# Patient Record
Sex: Female | Born: 1985 | Hispanic: Yes | Marital: Married | Smoking: Never smoker
Health system: Southern US, Community
[De-identification: ages and names within clinical notes are randomized; demographics above are authoritative.]

## PROBLEM LIST (undated history)

## (undated) DIAGNOSIS — D573 Sickle-cell trait: Secondary | ICD-10-CM

## (undated) DIAGNOSIS — O24419 Gestational diabetes mellitus in pregnancy, unspecified control: Secondary | ICD-10-CM

## (undated) DIAGNOSIS — O4400 Placenta previa specified as without hemorrhage, unspecified trimester: Secondary | ICD-10-CM

## (undated) HISTORY — DX: Sickle-cell trait: D57.3

## (undated) HISTORY — DX: Gestational diabetes mellitus in pregnancy, unspecified control: O24.419

## (undated) HISTORY — DX: Complete placenta previa nos or without hemorrhage, unspecified trimester: O44.00

---

## 2018-06-06 NOTE — L&D Delivery Note (Signed)
       Delivery Note   Brenda Rogers is a 33 y.o. G2P0101 at [redacted]w[redacted]d Estimated Date of Delivery: 05/26/19  PRE-OPERATIVE DIAGNOSIS:  1) [redacted]w[redacted]d pregnancy.  2) Labor  POST-OPERATIVE DIAGNOSIS:  1) [redacted]w[redacted]d pregnancy s/p Vaginal, Spontaneous  2) Viable female infant  Delivery Type: Vaginal, Spontaneous    Delivery Anesthesia: None   Labor Complications:      ESTIMATED BLOOD LOSS: 125  ml    FINDINGS:   1) female infant, Apgar scores of 8   at 1 minute and 9   at 5 minutes and a birthweight of   ounces.    2) Nuchal cord: Yes  SPECIMENS:   PLACENTA:   Appearance: Intact    Removal: Spontaneous      Disposition:    DISPOSITION:  Infant to left in stable condition in the delivery room, with L&D personnel and mother,  NARRATIVE SUMMARY: Labor course:  Ms. [name] is a G2P0101 at [redacted]w[redacted]d who presented for labor management.  She progressed well in labor without pitocin. AROM was performed. She evidenced good maternal expulsive effort during the second stage. She went on to deliver a viable infant. The placenta delivered without problems and was noted to be complete. A perineal and vaginal examination was performed. Episiotomy/Lacerations: None   Finis Bud, M.D. 05/18/2019 5:12 PM

## 2018-10-18 ENCOUNTER — Other Ambulatory Visit: Payer: Self-pay

## 2018-10-18 ENCOUNTER — Ambulatory Visit: Payer: Self-pay | Admitting: Certified Nurse Midwife

## 2018-10-18 ENCOUNTER — Encounter: Payer: Self-pay | Admitting: Certified Nurse Midwife

## 2018-10-18 VITALS — BP 128/86 | HR 110 | Ht <= 58 in | Wt 131.6 lb

## 2018-10-18 DIAGNOSIS — N912 Amenorrhea, unspecified: Secondary | ICD-10-CM

## 2018-10-18 DIAGNOSIS — R102 Pelvic and perineal pain: Secondary | ICD-10-CM

## 2018-10-18 DIAGNOSIS — Z8759 Personal history of other complications of pregnancy, childbirth and the puerperium: Secondary | ICD-10-CM

## 2018-10-18 LAB — POCT URINALYSIS DIPSTICK OB
Bilirubin, UA: NEGATIVE
Blood, UA: NEGATIVE
Glucose, UA: NEGATIVE
Ketones, UA: NEGATIVE
Leukocytes, UA: NEGATIVE
Nitrite, UA: NEGATIVE
POC,PROTEIN,UA: NEGATIVE
Spec Grav, UA: 1.02 (ref 1.010–1.025)
Urobilinogen, UA: 0.2 E.U./dL
pH, UA: 6 (ref 5.0–8.0)

## 2018-10-18 LAB — POCT URINE PREGNANCY: Preg Test, Ur: POSITIVE — AB

## 2018-10-18 NOTE — Progress Notes (Signed)
Patient here for pregnancy confirmation, c/o lower left pelvic pain yesterday.  No VB.

## 2018-10-18 NOTE — Progress Notes (Signed)
GYN ENCOUNTER NOTE  Subjective:       Brenda Rogers is a 33 y.o. 272P0101 female here for pregnancy confirmation.   Reports missed menses with positive home pregnancy test x 2.  Endorses nausea with rare vomiting, breast tenderness and indigestion.   Denies difficulty breathing or respiratory distress, chest pain, vaginal bleeding, dysuria, and leg pain or swelling.   History significant for irregular menses, placenta previa in previous pregnancy that resolved, and pelvic pain.    Gynecologic History  Patient's last menstrual period was 08/14/2018 (exact date).  Gestational age: 35 weeks 2 days.  Estimated date of birth: 05/21/2017.   Contraception: none  Last Pap: 2018. Results were: normal  Obstetric History  OB History  Gravida Para Term Preterm AB Living  2 1 0 1 0 1  SAB TAB Ectopic Multiple Live Births  0 0 0 0 1    # Outcome Date GA Lbr Len/2nd Weight Sex Delivery Anes PTL Lv  2 Current           1 Preterm 08/30/08   4 lb (1.814 kg) F Vag-Spont  N LIV     Complications: Placenta Previa    Past Medical History:  Diagnosis Date  . Placenta previa   . Sickle cell trait (HCC)      Current Outpatient Medications on File Prior to Visit  Medication Sig Dispense Refill  . Prenatal Vit-Fe Fumarate-FA (MULTIVITAMIN-PRENATAL) 27-0.8 MG TABS tablet Take 1 tablet by mouth daily at 12 noon.     No current facility-administered medications on file prior to visit.     No Known Allergies  Social History   Socioeconomic History  . Marital status: Married    Spouse name: Not on file  . Number of children: Not on file  . Years of education: Not on file  . Highest education level: Not on file  Occupational History  . Not on file  Social Needs  . Financial resource strain: Not on file  . Food insecurity:    Worry: Not on file    Inability: Not on file  . Transportation needs:    Medical: Not on file    Non-medical: Not on file  Tobacco Use  . Smoking status:  Never Smoker  . Smokeless tobacco: Never Used  Substance and Sexual Activity  . Alcohol use: Never    Frequency: Never  . Drug use: Never  . Sexual activity: Yes    Birth control/protection: None  Lifestyle  . Physical activity:    Days per week: Not on file    Minutes per session: Not on file  . Stress: Not on file  Relationships  . Social connections:    Talks on phone: Not on file    Gets together: Not on file    Attends religious service: Not on file    Active member of club or organization: Not on file    Attends meetings of clubs or organizations: Not on file    Relationship status: Not on file  . Intimate partner violence:    Fear of current or ex partner: Not on file    Emotionally abused: Not on file    Physically abused: Not on file    Forced sexual activity: Not on file  Other Topics Concern  . Not on file  Social History Narrative  . Not on file    Family History  Problem Relation Age of Onset  . Breast cancer Neg Hx   . Ovarian cancer Neg  Hx   . Colon cancer Neg Hx     The following portions of the patient's history were reviewed and updated as appropriate: allergies, current medications, past family history, past medical history, past social history, past surgical history and problem list.  Review of Systems  ROS negative except as noted above. Information obtained from patient.   Objective:   BP 128/86   Pulse (!) 110   Ht 4\' 10"  (1.473 m)   Wt 131 lb 9.6 oz (59.7 kg)   LMP 08/14/2018 (Exact Date)   BMI 27.50 kg/m    CONSTITUTIONAL: Well-developed, well-nourished female in no acute distress.   PHYSICAL EXAM: Not indicated.   Recent Results (from the past 2160 hour(s))  POC Urinalysis Dipstick OB     Status: None   Collection Time: 10/18/18  2:23 PM  Result Value Ref Range   Color, UA Yellow    Clarity, UA Clear    Glucose, UA Negative Negative   Bilirubin, UA Negative    Ketones, UA Negative    Spec Grav, UA 1.020 1.010 - 1.025    Blood, UA Negative    pH, UA 6.0 5.0 - 8.0   POC,PROTEIN,UA Negative Negative, Trace, Small (1+), Moderate (2+), Large (3+), 4+   Urobilinogen, UA 0.2 0.2 or 1.0 E.U./dL   Nitrite, UA Negative    Leukocytes, UA Negative Negative   Appearance     Odor    POCT urine pregnancy     Status: Abnormal   Collection Time: 10/18/18  2:24 PM  Result Value Ref Range   Preg Test, Ur Positive (A) Negative    Assessment:   1. Amenorrhea  - POCT urine pregnancy - US OB LESS THAN 14 WEEKS WITH OB TRANSVAGINAL; Future  2. History of placenta previa  - POC Urinalysis Dipstick OB - US OB LESS THAN 14 WEEKS WITH OB TRANSVAGINAL; Future  3. Pelvic pain  - US OB LESS THAN 14 WEEKS WITH OB TRANSVAGINAL; Future  Plan:   First trimester education, see AVS.   Reviewed red flag symptoms and when to call.   RTC x 1 week for dating/viability ultrasound and nurse intake.   RTC x 3-4 weeks for NOB PE with JML   Gunnar Bulla, CNM [facility], [facility] 10/18/18 2:33 PM

## 2018-10-18 NOTE — Patient Instructions (Signed)
Abdominal Pain During Pregnancy  Belly (abdominal) pain is common during pregnancy. There are many possible causes. Most of the time, it is not a serious problem. Other times, it can be a sign that something is wrong with the pregnancy. Always tell your doctor if you have belly pain. Follow these instructions at home:  Do not have sex or put anything in your vagina until your pain goes away completely.  Get plenty of rest until your pain gets better.  Drink enough fluid to keep your pee (urine) pale yellow.  Take over-the-counter and prescription medicines only as told by your doctor.  Keep all follow-up visits as told by your doctor. This is important. Contact a doctor if:  Your pain continues or gets worse after resting.  You have lower belly pain that: ? Comes and goes at regular times. ? Spreads to your back. ? Feels like menstrual cramps.  You have pain or burning when you pee (urinate). Get help right away if:  You have a fever or chills.  You have vaginal bleeding.  You are leaking fluid from your vagina.  You are passing tissue from your vagina.  You throw up (vomit) for more than 24 hours.  You have watery poop (diarrhea) for more than 24 hours.  Your baby is moving less than usual.  You feel very weak or faint.  You have shortness of breath.  You have very bad pain in your upper belly. Summary  Belly (abdominal) pain is common during pregnancy. There are many possible causes.  If you have belly pain during pregnancy, tell your doctor right away.  Keep all follow-up visits as told by your doctor. This is important. This information is not intended to replace advice given to you by your health care provider. Make sure you discuss any questions you have with your health care provider. Document Released: 05/11/2009 Document Revised: 08/25/2016 Document Reviewed: 08/25/2016 Elsevier Interactive Patient Education  2019 Else[employer] Inc. Vaginal Bleeding During  Pregnancy, First Trimester  A small amount of bleeding (spotting) from the vagina is common during early pregnancy. Sometimes the bleeding is normal and does not cause problems. At other times, though, bleeding may be a sign of something serious. Tell your doctor about any bleeding from your vagina right away. Follow these instructions at home: Activity  Follow your doctor's instructions about how active you can be.  If needed, make plans for someone to help with your normal activities.  Do not have sex or orgasms until your doctor says that this is safe. General instructions  Take over-the-counter and prescription medicines only as told by your doctor.  Watch your condition for any changes.  Write down: ? The number of pads you use each day. ? How often you change pads. ? How soaked (saturated) your pads are.  Do not use tampons.  Do not douche.  If you pass any tissue from your vagina, save it to show to your doctor.  Keep all follow-up visits as told by your doctor. This is important. Contact a doctor if:  You have vaginal bleeding at any time while you are pregnant.  You have cramps.  You have a fever. Get help right away if:  You have very bad cramps in your back or belly (abdomen).  You pass large clots or a lot of tissue from your vagina.  Your bleeding gets worse.  You feel light-headed.  You feel weak.  You pass out (faint).  You have chills.  You are  leaking fluid from your vagina.  You have a gush of fluid from your vagina. Summary  Sometimes vaginal bleeding during pregnancy is normal and does not cause problems. At other times, bleeding may be a sign of something serious.  Tell your doctor about any bleeding from your vagina right away.  Follow your doctor's instructions about how active you can be. You may need someone to help you with your normal activities. This information is not intended to replace advice given to you by your health care  provider. Make sure you discuss any questions you have with your health care provider. Document Released: 10/07/2013 Document Revised: 08/24/2016 Document Reviewed: 08/24/2016 Elsevier Interactive Patient Education  2019 [employer] Inc. Back Pain in Pregnancy Back pain during pregnancy is common. Back pain may be caused by several factors that are related to changes during your pregnancy. Follow these instructions at home: Managing pain, stiffness, and swelling      If directed, for sudden (acute) back pain, put ice on the painful area. ? Put ice in a plastic bag. ? Place a towel between your skin and the bag. ? Leave the ice on for 20 minutes, 2-3 times per day.  If directed, apply heat to the affected area before you exercise. Use the heat source that your health care provider recommends, such as a moist heat pack or a heating pad. ? Place a towel between your skin and the heat source. ? Leave the heat on for 20-30 minutes. ? Remove the heat if your skin turns bright red. This is especially important if you are unable to feel pain, heat, or cold. You may have a greater risk of getting burned.  If directed, massage the affected area. Activity  Exercise as told by your health care provider. Gentle exercise is the best way to prevent or manage back pain.  Listen to your body when lifting. If lifting hurts, ask for help or bend your knees. This uses your leg muscles instead of your back muscles.  Squat down when picking up something from the floor. Do not bend over.  Only use bed rest for short periods as told by your health care provider. Bed rest should only be used for the most severe episodes of back pain. Standing, sitting, and lying down  Do not stand in one place for long periods of time.  Use good posture when sitting. Make sure your head rests over your shoulders and is not hanging forward. Use a pillow on your lower back if necessary.  Try sleeping on your side, preferably  the left side, with a pregnancy support pillow or 1-2 regular pillows between your legs. ? If you have back pain after a night's rest, your bed may be too soft. ? A firm mattress may provide more support for your back during pregnancy. General instructions  Do not wear high heels.  Eat a healthy diet. Try to gain weight within your health care provider's recommendations.  Use a maternity girdle, elastic sling, or back brace as told by your health care provider.  Take over-the-counter and prescription medicines only as told by your health care provider.  Work with a physical therapist or massage therapist to find ways to manage back pain. Acupuncture or massage therapy may be helpful.  Keep all follow-up visits as told by your health care provider. This is important. Contact a health care provider if:  Your back pain interferes with your daily activities.  You have increasing pain in other parts of  your body. Get help right away if:  You develop numbness, tingling, weakness, or problems with the use of your arms or legs.  You develop severe back pain that is not controlled with medicine.  You have a change in bowel or bladder control.  You develop shortness of breath, dizziness, or you faint.  You develop nausea, vomiting, or sweating.  You have back pain that is a rhythmic, cramping pain similar to labor pains. Labor pain is usually 1-2 minutes apart, lasts for about 1 minute, and involves a bearing down feeling or pressure in your pelvis.  You have back pain and your water breaks or you have vaginal bleeding.  You have back pain or numbness that travels down your leg.  Your back pain developed after you fell.  You develop pain on one side of your back.  You see blood in your urine.  You develop skin blisters in the area of your back pain. Summary  Back pain may be caused by several factors that are related to changes during your pregnancy.  Follow instructions as  told by your health care provider for managing pain, stiffness, and swelling.  Exercise as told by your health care provider. Gentle exercise is the best way to prevent or manage back pain.  Take over-the-counter and prescription medicines only as told by your health care provider.  Keep all follow-up visits as told by your health care provider. This is important. This information is not intended to replace advice given to you by your health care provider. Make sure you discuss any questions you have with your health care provider. Document Released: 08/31/2005 Document Revised: 11/08/2017 Document Reviewed: 11/08/2017 Elsevier Interactive Patient Education  2019 [employer]. Eating Plan for Pregnant Women While you are pregnant, your body requires additional nutrition to help support your growing baby. You also have a higher need for some vitamins and minerals, such as folic acid, calcium, iron, and vitamin D. Eating a healthy, well-balanced diet is very important for your health and your baby's health. Your need for extra calories varies for the three 68-month segments of your pregnancy (trimesters). For most women, it is recommended to consume:  150 extra calories a day during the first trimester.  300 extra calories a day during the second trimester.  300 extra calories a day during the third trimester. What are tips for following this plan?   Do not try to lose weight or go on a diet during pregnancy.  Limit your overall intake of foods that have "empty calories." These are foods that have little nutritional value, such as sweets, desserts, candies, and sugar-sweetened beverages.  Eat a variety of foods (especially fruits and vegetables) to get a full range of vitamins and minerals.  Take a prenatal vitamin to help meet your additional vitamin and mineral needs during pregnancy, specifically for folic acid, iron, calcium, and vitamin D.  Remember to stay active. Ask your health  care provider what types of exercise and activities are safe for you.  Practice good food safety and cleanliness. Wash your hands before you eat and after you prepare raw meat. Wash all fruits and vegetables well before peeling or eating. Taking these actions can help to prevent food-borne illnesses that can be very dangerous to your baby, such as listeriosis. Ask your health care provider for more information about listeriosis. What does 150 extra calories look like? Healthy options that provide 150 extra calories each day could be any of the following:  6-8 oz (  170-230 g) of plain low-fat yogurt with  cup of berries.  1 apple with 2 teaspoons (11 g) of peanut butter.  Cut-up vegetables with  cup (60 g) of hummus.  8 oz (230 mL) or 1 cup of low-fat chocolate milk.  1 stick of string cheese with 1 medium orange.  1 peanut butter and jelly sandwich that is made with one slice of whole-wheat bread and 1 tsp (5 g) of peanut butter. For 300 extra calories, you could eat two of those healthy options each day. What is a healthy amount of weight to gain? The right amount of weight gain for you is based on your BMI before you became pregnant. If your BMI:  Was less than 18 (underweight), you should gain 28-40 lb (13-18 kg).  Was 18-24.9 (normal), you should gain 25-35 lb (11-16 kg).  Was 25-29.9 (overweight), you should gain 15-25 lb (7-11 kg).  Was 30 or greater (obese), you should gain 11-20 lb (5-9 kg). What if I am having twins or multiples? Generally, if you are carrying twins or multiples:  You may need to eat 300-600 extra calories a day.  The recommended range for total weight gain is 25-54 lb (11-25 kg), depending on your BMI before pregnancy.  Talk with your health care provider to find out about nutritional needs, weight gain, and exercise that is right for you. What foods can I eat?  Grains All grains. Choose whole grains, such as whole-wheat bread, oatmeal, or brown  rice. Vegetables All vegetables. Eat a variety of colors and types of vegetables. Remember to wash your vegetables well before peeling or eating. Fruits All fruits. Eat a variety of colors and types of fruit. Remember to wash your fruits well before peeling or eating. Meats and other protein foods Lean meats, including chicken, [place], fish, and lean cuts of beef, veal, or pork. If you eat fish or seafood, choose options that are higher in omega-3 fatty acids and lower in mercury, such as salmon, herring, mussels, trout, sardines, pollock, shrimp, crab, and lobster. Tofu. Tempeh. Beans. Eggs. Peanut butter and other nut butters. Make sure that all meats, poultry, and eggs are cooked to food-safe temperatures or "well-done." Two or more servings of fish are recommended each week in order to get the most benefits from omega-3 fatty acids that are found in seafood. Choose fish that are lower in mercury. You can find more information online:  PumpkinSearch.com.ee Dairy Pasteurized milk and milk alternatives (such as almond milk). Pasteurized yogurt and pasteurized cheese. Cottage cheese. Sour cream. Beverages Water. Juices that contain 100% fruit juice or vegetable juice. Caffeine-free teas and decaffeinated coffee. Drinks that contain caffeine are okay to drink, but it is better to avoid caffeine. Keep your total caffeine intake to less than 200 mg each day (which is 12 oz or 355 mL of coffee, tea, or soda) or the limit as told by your health care provider. Fats and oils Fats and oils are okay to include in moderation. Sweets and desserts Sweets and desserts are okay to include in moderation. Seasoning and other foods All pasteurized condiments. The items listed above may not be a complete list of recommended foods and beverages. Contact your dietitian for more options. What foods are not recommended? Vegetables Raw (unpasteurized) vegetable juices. Fruits Unpasteurized fruit juices. Meats and other  protein foods Lunch meats, bologna, hot dogs, or other deli meats. (If you must eat those meats, reheat them until they are steaming hot.) Refrigerated pat, meat spreads  from a meat counter, smoked seafood that is found in the refrigerated section of a store. Raw or undercooked meats, poultry, and eggs. Raw fish, such as sushi or sashimi. Fish that have high mercury content, such as tilefish, shark, swordfish, and king mackerel. To learn more about mercury in fish, talk with your health care provider or look for online resources, such as:  PumpkinSearch.com.ee Dairy Raw (unpasteurized) milk and any foods that have raw milk in them. Soft cheeses, such as feta, queso blanco, queso fresco, Brie, Camembert cheeses, blue-veined cheeses, and Panela cheese (unless it is made with pasteurized milk, which must be stated on the label). Beverages Alcohol. Sugar-sweetened beverages, such as sodas, teas, or energy drinks. Seasoning and other foods Homemade fermented foods and drinks, such as pickles, sauerkraut, or kombucha drinks. (Store-bought pasteurized versions of these are okay.) Salads that are made in a store or deli, such as ham salad, chicken salad, egg salad, tuna salad, and seafood salad. The items listed above may not be a complete list of foods and beverages to avoid. Contact your dietitian for more information. Where to find more information To calculate the number of calories you need based on your height, weight, and activity level, you can use an online calculator such as:  PackageNews.is To calculate how much weight you should gain during pregnancy, you can use an online pregnancy weight gain calculator such as:  http://jones-berg.com/ Summary  While you are pregnant, your body requires additional nutrition to help support your growing baby.  Eat a variety of foods, especially fruits and vegetables to get a full range of vitamins and minerals.   Practice good food safety and cleanliness. Wash your hands before you eat and after you prepare raw meat. Wash all fruits and vegetables well before peeling or eating. Taking these actions can help to prevent food-borne illnesses, such as listeriosis, that can be very dangerous to your baby.  Do not eat raw meat or fish. Do not eat fish that have high mercury content, such as tilefish, shark, swordfish, and king mackerel. Do not eat unpasteurized (raw) dairy.  Take a prenatal vitamin to help meet your additional vitamin and mineral needs during pregnancy, specifically for folic acid, iron, calcium, and vitamin D. This information is not intended to replace advice given to you by your health care provider. Make sure you discuss any questions you have with your health care provider. Document Released: 03/07/2014 Document Revised: 02/17/2017 Document Reviewed: 02/17/2017 Elsevier Interactive Patient Education  2019 [employer]. First Trimester of Pregnancy  The first trimester of pregnancy is from week 1 until the end of week 13 (months 1 through 3). During this time, your baby will begin to develop inside you. At 6-8 weeks, the eyes and face are formed, and the heartbeat can be seen on ultrasound. At the end of 12 weeks, all the baby's organs are formed. Prenatal care is all the medical care you receive before the birth of your baby. Make sure you get good prenatal care and follow all of your doctor's instructions. Follow these instructions at home: Medicines  Take over-the-counter and prescription medicines only as told by your doctor. Some medicines are safe and some medicines are not safe during pregnancy.  Take a prenatal vitamin that contains at least 600 micrograms (mcg) of folic acid.  If you have trouble pooping (constipation), take medicine that will make your stool soft (stool softener) if your doctor approves. Eating and drinking   Eat regular, healthy meals.  Your  doctor will  tell you the amount of weight gain that is right for you.  Avoid raw meat and uncooked cheese.  If you feel sick to your stomach (nauseous) or throw up (vomit): ? Eat 4 or 5 small meals a day instead of 3 large meals. ? Try eating a few soda crackers. ? Drink liquids between meals instead of during meals.  To prevent constipation: ? Eat foods that are high in fiber, like fresh fruits and vegetables, whole grains, and beans. ? Drink enough fluids to keep your pee (urine) clear or pale yellow. Activity  Exercise only as told by your doctor. Stop exercising if you have cramps or pain in your lower belly (abdomen) or low back.  Do not exercise if it is too hot, too humid, or if you are in a place of great height (high altitude).  Try to avoid standing for long periods of time. Move your legs often if you must stand in one place for a long time.  Avoid heavy lifting.  Wear low-heeled shoes. Sit and stand up straight.  You can have sex unless your doctor tells you not to. Relieving pain and discomfort  Wear a good support bra if your breasts are sore.  Take warm water baths (sitz baths) to soothe pain or discomfort caused by hemorrhoids. Use hemorrhoid cream if your doctor says it is okay.  Rest with your legs raised if you have leg cramps or low back pain.  If you have puffy, bulging veins (varicose veins) in your legs: ? Wear support hose or compression stockings as told by your doctor. ? Raise (elevate) your feet for 15 minutes, 3-4 times a day. ? Limit salt in your food. Prenatal care  Schedule your prenatal visits by the twelfth week of pregnancy.  Write down your questions. Take them to your prenatal visits.  Keep all your prenatal visits as told by your doctor. This is important. Safety  Wear your seat belt at all times when driving.  Make a list of emergency phone numbers. The list should include numbers for family, friends, the hospital, and police and fire  departments. General instructions  Ask your doctor for a referral to a local prenatal class. Begin classes no later than at the start of month 6 of your pregnancy.  Ask for help if you need counseling or if you need help with nutrition. Your doctor can give you advice or tell you where to go for help.  Do not use hot tubs, steam rooms, or saunas.  Do not douche or use tampons or scented sanitary pads.  Do not cross your legs for long periods of time.  Avoid all herbs and alcohol. Avoid drugs that are not approved by your doctor.  Do not use any tobacco products, including cigarettes, chewing tobacco, and electronic cigarettes. If you need help quitting, ask your doctor. You may get counseling or other support to help you quit.  Avoid cat litter boxes and soil used by cats. These carry germs that can cause birth defects in the baby and can cause a loss of your baby (miscarriage) or stillbirth.  Visit your dentist. At home, brush your teeth with a soft toothbrush. Be gentle when you floss. Contact a doctor if:  You are dizzy.  You have mild cramps or pressure in your lower belly.  You have a nagging pain in your belly area.  You continue to feel sick to your stomach, you throw up, or you have watery poop (  diarrhea).  You have a bad smelling fluid coming from your vagina.  You have pain when you pee (urinate).  You have increased puffiness (swelling) in your face, hands, legs, or ankles. Get help right away if:  You have a fever.  You are leaking fluid from your vagina.  You have spotting or bleeding from your vagina.  You have very bad belly cramping or pain.  You gain or lose weight rapidly.  You throw up blood. It may look like coffee grounds.  You are around people who have [place] measles, fifth disease, or chickenpox.  You have a very bad headache.  You have shortness of breath.  You have any kind of trauma, such as from a fall or a car accident. Summary   The first trimester of pregnancy is from week 1 until the end of week 13 (months 1 through 3).  To take care of yourself and your unborn baby, you will need to eat healthy meals, take medicines only if your doctor tells you to do so, and do activities that are safe for you and your baby.  Keep all follow-up visits as told by your doctor. This is important as your doctor will have to ensure that your baby is healthy and growing well. This information is not intended to replace advice given to you by your health care provider. Make sure you discuss any questions you have with your health care provider. Document Released: 11/09/2007 Document Revised: 05/31/2016 Document Reviewed: 05/31/2016 Elsevier Interactive Patient Education  2019 [employer]

## 2018-10-24 ENCOUNTER — Telehealth: Payer: Self-pay

## 2018-10-24 NOTE — Telephone Encounter (Signed)
Coronavirus (COVID-19) Are you at risk?  Are you at risk for the Coronavirus (COVID-19)?  To be considered HIGH RISK for Coronavirus (COVID-19), you have to meet the following criteria:  . Traveled to [place], [place], [place], [place] or [place]; or in the [place] to [place], [place], [place], or [place]; and have fever, cough, and shortness of breath within the last 2 weeks of travel OR . Been in close contact with a person diagnosed with COVID-19 within the last 2 weeks and have fever, cough, and shortness of breath . IF YOU DO NOT MEET THESE CRITERIA, YOU ARE CONSIDERED LOW RISK FOR COVID-19.  What to do if you are HIGH RISK for COVID-19?  . If you are having a medical emergency, call 911. . Seek medical care right away. Before you go to a doctor's office, urgent care or emergency department, call ahead and tell them about your recent travel, contact with someone diagnosed with COVID-19, and your symptoms. You should receive instructions from your physician's office regarding next steps of care.  . When you arrive at healthcare provider, tell the healthcare staff immediately you have returned from visiting [place], [place], [place], [place] or [place]; or traveled in the [place] to [place], [place], [place], or [place]; in the last two weeks or you have been in close contact with a person diagnosed with COVID-19 in the last 2 weeks.   . Tell the health care staff about your symptoms: fever, cough and shortness of breath. . After you have been seen by a medical provider, you will be either: o Tested for (COVID-19) and discharged home on quarantine except to seek medical care if symptoms worsen, and asked to  - Stay home and avoid contact with others until you get your results (4-5 days)  - Avoid travel on public transportation if possible (such as bus, train, or airplane) or o Sent to the Emergency Department by EMS for evaluation, COVID-19 testing, and possible  admission depending on your condition and test results.  What to do if you are LOW RISK for COVID-19?  Reduce your risk of any infection by using the same precautions used for avoiding the common cold or flu:  . Wash your hands often with soap and warm water for at least 20 seconds.  If soap and water are not readily available, use an alcohol-based hand sanitizer with at least 60% alcohol.  . If coughing or sneezing, cover your mouth and nose by coughing or sneezing into the elbow areas of your shirt or coat, into a tissue or into your sleeve (not your hands). . Avoid shaking hands with others and consider head nods or verbal greetings only. . Avoid touching your eyes, nose, or mouth with unwashed hands.  . Avoid close contact with people who are sick. . Avoid places or events with large numbers of people in one location, like concerts or sporting events. . Carefully consider travel plans you have or are making. . If you are planning any travel outside or inside the [place], visit the CDC's Travelers' Health webpage for the latest health notices. . If you have some symptoms but not all symptoms, continue to monitor at home and seek medical attention if your symptoms worsen. . If you are having a medical emergency, call 911.   ADDITIONAL HEALTHCARE OPTIONS FOR PATIENTS  [facility] / e-Visit: [email or web address]         [facility]: [phone]  [facility]: [phone]                   [facility]: [phone]   Prescreened. Neg .cm 

## 2018-10-25 ENCOUNTER — Other Ambulatory Visit: Payer: Self-pay

## 2018-10-25 ENCOUNTER — Ambulatory Visit: Payer: Self-pay | Admitting: Certified Nurse Midwife

## 2018-10-25 ENCOUNTER — Ambulatory Visit (INDEPENDENT_AMBULATORY_CARE_PROVIDER_SITE_OTHER): Payer: Self-pay

## 2018-10-25 VITALS — BP 130/80 | HR 95 | Ht 58.5 in | Wt 134.5 lb

## 2018-10-25 DIAGNOSIS — Z3A09 9 weeks gestation of pregnancy: Secondary | ICD-10-CM

## 2018-10-25 DIAGNOSIS — Z8759 Personal history of other complications of pregnancy, childbirth and the puerperium: Secondary | ICD-10-CM

## 2018-10-25 DIAGNOSIS — O09291 Supervision of pregnancy with other poor reproductive or obstetric history, first trimester: Secondary | ICD-10-CM

## 2018-10-25 DIAGNOSIS — N912 Amenorrhea, unspecified: Secondary | ICD-10-CM

## 2018-10-25 DIAGNOSIS — Z3687 Encounter for antenatal screening for uncertain dates: Secondary | ICD-10-CM

## 2018-10-25 DIAGNOSIS — R102 Pelvic and perineal pain: Secondary | ICD-10-CM

## 2018-10-25 NOTE — Patient Instructions (Signed)
First Trimester of Pregnancy  The first trimester of pregnancy is from week 1 until the end of week 13 (months 1 through 3). During this time, your baby will begin to develop inside you. At 6-8 weeks, the eyes and face are formed, and the heartbeat can be seen on ultrasound. At the end of 12 weeks, all the baby's organs are formed. Prenatal care is all the medical care you receive before the birth of your baby. Make sure you get good prenatal care and follow all of your doctor's instructions. Follow these instructions at home: Medicines  Take over-the-counter and prescription medicines only as told by your doctor. Some medicines are safe and some medicines are not safe during pregnancy.  Take a prenatal vitamin that contains at least 600 micrograms (mcg) of folic acid.  If you have trouble pooping (constipation), take medicine that will make your stool soft (stool softener) if your doctor approves. Eating and drinking   Eat regular, healthy meals.  Your doctor will tell you the amount of weight gain that is right for you.  Avoid raw meat and uncooked cheese.  If you feel sick to your stomach (nauseous) or throw up (vomit): ? Eat 4 or 5 small meals a day instead of 3 large meals. ? Try eating a few soda crackers. ? Drink liquids between meals instead of during meals.  To prevent constipation: ? Eat foods that are high in fiber, like fresh fruits and vegetables, whole grains, and beans. ? Drink enough fluids to keep your pee (urine) clear or pale yellow. Activity  Exercise only as told by your doctor. Stop exercising if you have cramps or pain in your lower belly (abdomen) or low back.  Do not exercise if it is too hot, too humid, or if you are in a place of great height (high altitude).  Try to avoid standing for long periods of time. Move your legs often if you must stand in one place for a long time.  Avoid heavy lifting.  Wear low-heeled shoes. Sit and stand up straight.   You can have sex unless your doctor tells you not to. Relieving pain and discomfort  Wear a good support bra if your breasts are sore.  Take warm water baths (sitz baths) to soothe pain or discomfort caused by hemorrhoids. Use hemorrhoid cream if your doctor says it is okay.  Rest with your legs raised if you have leg cramps or low back pain.  If you have puffy, bulging veins (varicose veins) in your legs: ? Wear support hose or compression stockings as told by your doctor. ? Raise (elevate) your feet for 15 minutes, 3-4 times a day. ? Limit salt in your food. Prenatal care  Schedule your prenatal visits by the twelfth week of pregnancy.  Write down your questions. Take them to your prenatal visits.  Keep all your prenatal visits as told by your doctor. This is important. Safety  Wear your seat belt at all times when driving.  Make a list of emergency phone numbers. The list should include numbers for family, friends, the hospital, and police and fire departments. General instructions  Ask your doctor for a referral to a local prenatal class. Begin classes no later than at the start of month 6 of your pregnancy.  Ask for help if you need counseling or if you need help with nutrition. Your doctor can give you advice or tell you where to go for help.  Do not use hot tubs, steam   rooms, or saunas.  Do not douche or use tampons or scented sanitary pads.  Do not cross your legs for long periods of time.  Avoid all herbs and alcohol. Avoid drugs that are not approved by your doctor.  Do not use any tobacco products, including cigarettes, chewing tobacco, and electronic cigarettes. If you need help quitting, ask your doctor. You may get counseling or other support to help you quit.  Avoid cat litter boxes and soil used by cats. These carry germs that can cause birth defects in the baby and can cause a loss of your baby (miscarriage) or stillbirth.  Visit your dentist. At home,  brush your teeth with a soft toothbrush. Be gentle when you floss. Contact a doctor if:  You are dizzy.  You have mild cramps or pressure in your lower belly.  You have a nagging pain in your belly area.  You continue to feel sick to your stomach, you throw up, or you have watery poop (diarrhea).  You have a bad smelling fluid coming from your vagina.  You have pain when you pee (urinate).  You have increased puffiness (swelling) in your face, hands, legs, or ankles. Get help right away if:  You have a fever.  You are leaking fluid from your vagina.  You have spotting or bleeding from your vagina.  You have very bad belly cramping or pain.  You gain or lose weight rapidly.  You throw up blood. It may look like coffee grounds.  You are around people who have German measles, fifth disease, or chickenpox.  You have a very bad headache.  You have shortness of breath.  You have any kind of trauma, such as from a fall or a car accident. Summary  The first trimester of pregnancy is from week 1 until the end of week 13 (months 1 through 3).  To take care of yourself and your unborn baby, you will need to eat healthy meals, take medicines only if your doctor tells you to do so, and do activities that are safe for you and your baby.  Keep all follow-up visits as told by your doctor. This is important as your doctor will have to ensure that your baby is healthy and growing well. This information is not intended to replace advice given to you by your health care provider. Make sure you discuss any questions you have with your health care provider. Document Released: 11/09/2007 Document Revised: 05/31/2016 Document Reviewed: 05/31/2016 Elsevier Interactive Patient Education  2019 [employer]  

## 2018-10-25 NOTE — Progress Notes (Signed)
Aronda [place] presents for NOB nurse interview visit. Pregnancy confirmation done here at [facility].   G- 2.  P-1    . Pregnancy education material explained and given. _There is_ cats in the home. NOB labs ordered. Marland Kitchen HIV labs and Drug screen were explained optional and she did not decline. Drug screen ordered/. PNV encouraged. Genetic screening options discussed. Genetic testing:/Unsure.  Pt may discuss with provider.Financial policy reviewed, she does not work outside of the home, all questions answered and she already has appt for NOB PE set up.

## 2018-10-26 LAB — URINALYSIS, ROUTINE W REFLEX MICROSCOPIC
Bilirubin, UA: NEGATIVE
Glucose, UA: NEGATIVE
Ketones, UA: NEGATIVE
Nitrite, UA: NEGATIVE
Protein,UA: NEGATIVE
RBC, UA: NEGATIVE
Specific Gravity, UA: 1.006 (ref 1.005–1.030)
Urobilinogen, Ur: 0.2 mg/dL (ref 0.2–1.0)
pH, UA: 7 (ref 5.0–7.5)

## 2018-10-26 LAB — MICROSCOPIC EXAMINATION
Bacteria, UA: NONE SEEN
Casts: NONE SEEN /lpf
RBC, Urine: NONE SEEN /hpf (ref 0–2)

## 2018-10-26 LAB — MONITOR DRUG PROFILE 14(MW)
Amphetamine Scrn, Ur: NEGATIVE ng/mL
BARBITURATE SCREEN URINE: NEGATIVE ng/mL
BENZODIAZEPINE SCREEN, URINE: NEGATIVE ng/mL
Buprenorphine, Urine: NEGATIVE ng/mL
CANNABINOIDS UR QL SCN: NEGATIVE ng/mL
Cocaine (Metab) Scrn, Ur: NEGATIVE ng/mL
Creatinine(Crt), U: 25.7 mg/dL (ref 20.0–300.0)
Fentanyl, Urine: NEGATIVE pg/mL
Meperidine Screen, Urine: NEGATIVE ng/mL
Methadone Screen, Urine: NEGATIVE ng/mL
OXYCODONE+OXYMORPHONE UR QL SCN: NEGATIVE ng/mL
Opiate Scrn, Ur: NEGATIVE ng/mL
Ph of Urine: 7 (ref 4.5–8.9)
Phencyclidine Qn, Ur: NEGATIVE ng/mL
Propoxyphene Scrn, Ur: NEGATIVE ng/mL
SPECIFIC GRAVITY: 1.004
Tramadol Screen, Urine: NEGATIVE ng/mL

## 2018-10-26 LAB — VARICELLA ZOSTER ANTIBODY, IGG: Varicella zoster IgG: 1301 index (ref >165)

## 2018-10-26 LAB — ABO AND RH: Rh Factor: POSITIVE

## 2018-10-26 LAB — RUBELLA SCREEN: Rubella Antibodies, IGG: 5.81 index (ref >0.99)

## 2018-10-26 LAB — RPR: RPR Ser Ql: NONREACTIVE

## 2018-10-26 LAB — HEPATITIS B SURFACE ANTIGEN: Hepatitis B Surface Ag: NEGATIVE

## 2018-10-26 LAB — HGB SOLU + RFLX FRAC: Sickle Solubility Test - HGBRFX: NEGATIVE

## 2018-10-26 LAB — HIV ANTIBODY (ROUTINE TESTING W REFLEX): HIV Screen 4th Generation wRfx: NONREACTIVE

## 2018-10-26 LAB — ANTIBODY SCREEN: Antibody Screen: NEGATIVE

## 2018-10-27 LAB — URINE CULTURE

## 2018-10-27 NOTE — Progress Notes (Signed)
I have reviewed the record and concur with patient management and plan of care.    Dezman Granda Michelle Calley Drenning, CNM [facility] 

## 2018-10-30 LAB — GC/CHLAMYDIA PROBE AMP
Chlamydia trachomatis, NAA: NEGATIVE
Neisseria Gonorrhoeae by PCR: NEGATIVE

## 2018-11-08 ENCOUNTER — Ambulatory Visit (INDEPENDENT_AMBULATORY_CARE_PROVIDER_SITE_OTHER): Payer: Self-pay | Admitting: Certified Nurse Midwife

## 2018-11-08 ENCOUNTER — Other Ambulatory Visit: Payer: Self-pay

## 2018-11-08 VITALS — BP 111/76 | HR 104 | Wt 132.3 lb

## 2018-11-08 DIAGNOSIS — Z3A11 11 weeks gestation of pregnancy: Secondary | ICD-10-CM

## 2018-11-08 DIAGNOSIS — Z3481 Encounter for supervision of other normal pregnancy, first trimester: Secondary | ICD-10-CM

## 2018-11-08 LAB — POCT URINALYSIS DIPSTICK OB
Bilirubin, UA: NEGATIVE
Blood, UA: NEGATIVE
Glucose, UA: NEGATIVE
Ketones, UA: NEGATIVE
Leukocytes, UA: NEGATIVE
Nitrite, UA: NEGATIVE
POC,PROTEIN,UA: NEGATIVE
Spec Grav, UA: 1.02 (ref 1.010–1.025)
Urobilinogen, UA: 0.2 E.U./dL
pH, UA: 6 (ref 5.0–8.0)

## 2018-11-08 NOTE — Progress Notes (Signed)
Patient here for NOB physical, no complaints.  

## 2018-11-08 NOTE — Patient Instructions (Addendum)
Back Pain in Pregnancy Back pain during pregnancy is common. Back pain may be caused by several factors that are related to changes during your pregnancy. Follow these instructions at home: Managing pain, stiffness, and swelling      If directed, for sudden (acute) back pain, put ice on the painful area. ? Put ice in a plastic bag. ? Place a towel between your skin and the bag. ? Leave the ice on for 20 minutes, 2-3 times per day.  If directed, apply heat to the affected area before you exercise. Use the heat source that your health care provider recommends, such as a moist heat pack or a heating pad. ? Place a towel between your skin and the heat source. ? Leave the heat on for 20-30 minutes. ? Remove the heat if your skin turns bright red. This is especially important if you are unable to feel pain, heat, or cold. You may have a greater risk of getting burned.  If directed, massage the affected area. Activity  Exercise as told by your health care provider. Gentle exercise is the best way to prevent or manage back pain.  Listen to your body when lifting. If lifting hurts, ask for help or bend your knees. This uses your leg muscles instead of your back muscles.  Squat down when picking up something from the floor. Do not bend over.  Only use bed rest for short periods as told by your health care provider. Bed rest should only be used for the most severe episodes of back pain. Standing, sitting, and lying down  Do not stand in one place for long periods of time.  Use good posture when sitting. Make sure your head rests over your shoulders and is not hanging forward. Use a pillow on your lower back if necessary.  Try sleeping on your side, preferably the left side, with a pregnancy support pillow or 1-2 regular pillows between your legs. ? If you have back pain after a night's rest, your bed may be too soft. ? A firm mattress may provide more support for your back during pregnancy.  General instructions  Do not wear high heels.  Eat a healthy diet. Try to gain weight within your health care provider's recommendations.  Use a maternity girdle, elastic sling, or back brace as told by your health care provider.  Take over-the-counter and prescription medicines only as told by your health care provider.  Work with a physical therapist or massage therapist to find ways to manage back pain. Acupuncture or massage therapy may be helpful.  Keep all follow-up visits as told by your health care provider. This is important. Contact a health care provider if:  Your back pain interferes with your daily activities.  You have increasing pain in other parts of your body. Get help right away if:  You develop numbness, tingling, weakness, or problems with the use of your arms or legs.  You develop severe back pain that is not controlled with medicine.  You have a change in bowel or bladder control.  You develop shortness of breath, dizziness, or you faint.  You develop nausea, vomiting, or sweating.  You have back pain that is a rhythmic, cramping pain similar to labor pains. Labor pain is usually 1-2 minutes apart, lasts for about 1 minute, and involves a bearing down feeling or pressure in your pelvis.  You have back pain and your water breaks or you have vaginal bleeding.  You have back pain or numbness  that travels down your leg.  Your back pain developed after you fell.  You develop pain on one side of your back.  You see blood in your urine.  You develop skin blisters in the area of your back pain. Summary  Back pain may be caused by several factors that are related to changes during your pregnancy.  Follow instructions as told by your health care provider for managing pain, stiffness, and swelling.  Exercise as told by your health care provider. Gentle exercise is the best way to prevent or manage back pain.  Take over-the-counter and prescription  medicines only as told by your health care provider.  Keep all follow-up visits as told by your health care provider. This is important. This information is not intended to replace advice given to you by your health care provider. Make sure you discuss any questions you have with your health care provider. Document Released: 08/31/2005 Document Revised: 11/08/2017 Document Reviewed: 11/08/2017 Elsevier Interactive Patient Education  2019 [place] for Pregnant Women While you are pregnant, your body requires additional nutrition to help support your growing baby. You also have a higher need for some vitamins and minerals, such as folic acid, calcium, iron, and vitamin D. Eating a healthy, well-balanced diet is very important for your health and your baby's health. Your need for extra calories varies for the three 47-monthsegments of your pregnancy (trimesters). For most women, it is recommended to consume:  150 extra calories a day during the first trimester.  300 extra calories a day during the second trimester.  300 extra calories a day during the third trimester. What are tips for following this plan?   Do not try to lose weight or go on a diet during pregnancy.  Limit your overall intake of foods that have "empty calories." These are foods that have little nutritional value, such as sweets, desserts, candies, and sugar-sweetened beverages.  Eat a variety of foods (especially fruits and vegetables) to get a full range of vitamins and minerals.  Take a prenatal vitamin to help meet your additional vitamin and mineral needs during pregnancy, specifically for folic acid, iron, calcium, and vitamin D.  Remember to stay active. Ask your health care provider what types of exercise and activities are safe for you.  Practice good food safety and cleanliness. Wash your hands before you eat and after you prepare raw meat. Wash all fruits and vegetables well before peeling or  eating. Taking these actions can help to prevent food-borne illnesses that can be very dangerous to your baby, such as listeriosis. Ask your health care provider for more information about listeriosis. What does 150 extra calories look like? Healthy options that provide 150 extra calories each day could be any of the following:  6-8 oz (170-230 g) of plain low-fat yogurt with  cup of berries.  1 apple with 2 teaspoons (11 g) of peanut butter.  Cut-up vegetables with  cup (60 g) of hummus.  8 oz (230 mL) or 1 cup of low-fat chocolate milk.  1 stick of string cheese with 1 medium orange.  1 peanut butter and jelly sandwich that is made with one slice of whole-wheat bread and 1 tsp (5 g) of peanut butter. For 300 extra calories, you could eat two of those healthy options each day. What is a healthy amount of weight to gain? The right amount of weight gain for you is based on your BMI before you became pregnant. If your BMI:  Was less than 18 (underweight), you should gain 28-40 lb (13-18 kg).  Was 18-24.9 (normal), you should gain 25-35 lb (11-16 kg).  Was 25-29.9 (overweight), you should gain 15-25 lb (7-11 kg).  Was 30 or greater (obese), you should gain 11-20 lb (5-9 kg). What if I am having twins or multiples? Generally, if you are carrying twins or multiples:  You may need to eat 300-600 extra calories a day.  The recommended range for total weight gain is 25-54 lb (11-25 kg), depending on your BMI before pregnancy.  Talk with your health care provider to find out about nutritional needs, weight gain, and exercise that is right for you. What foods can I eat?  Grains All grains. Choose whole grains, such as whole-wheat bread, oatmeal, or brown rice. Vegetables All vegetables. Eat a variety of colors and types of vegetables. Remember to wash your vegetables well before peeling or eating. Fruits All fruits. Eat a variety of colors and types of fruit. Remember to wash your  fruits well before peeling or eating. Meats and other protein foods Lean meats, including chicken, [place], fish, and lean cuts of beef, veal, or pork. If you eat fish or seafood, choose options that are higher in omega-3 fatty acids and lower in mercury, such as salmon, herring, mussels, trout, sardines, pollock, shrimp, crab, and lobster. Tofu. Tempeh. Beans. Eggs. Peanut butter and other nut butters. Make sure that all meats, poultry, and eggs are cooked to food-safe temperatures or "well-done." Two or more servings of fish are recommended each week in order to get the most benefits from omega-3 fatty acids that are found in seafood. Choose fish that are lower in mercury. You can find more information online:  [employer] Dairy Pasteurized milk and milk alternatives (such as almond milk). Pasteurized yogurt and pasteurized cheese. Cottage cheese. Sour cream. Beverages Water. Juices that contain 100% fruit juice or vegetable juice. Caffeine-free teas and decaffeinated coffee. Drinks that contain caffeine are okay to drink, but it is better to avoid caffeine. Keep your total caffeine intake to less than 200 mg each day (which is 12 oz or 355 mL of coffee, tea, or soda) or the limit as told by your health care provider. Fats and oils Fats and oils are okay to include in moderation. Sweets and desserts Sweets and desserts are okay to include in moderation. Seasoning and other foods All pasteurized condiments. The items listed above may not be a complete list of recommended foods and beverages. Contact your dietitian for more options. What foods are not recommended? Vegetables Raw (unpasteurized) vegetable juices. Fruits Unpasteurized fruit juices. Meats and other protein foods Lunch meats, bologna, hot dogs, or other deli meats. (If you must eat those meats, reheat them until they are steaming hot.) Refrigerated pat, meat spreads from a meat counter, smoked seafood that is found in the  refrigerated section of a store. Raw or undercooked meats, poultry, and eggs. Raw fish, such as sushi or sashimi. Fish that have high mercury content, such as tilefish, shark, swordfish, and king mackerel. To learn more about mercury in fish, talk with your health care provider or look for online resources, such as:  [employer] Dairy Raw (unpasteurized) milk and any foods that have raw milk in them. Soft cheeses, such as feta, queso blanco, queso fresco, Brie, Camembert cheeses, blue-veined cheeses, and Panela cheese (unless it is made with pasteurized milk, which must be stated on the label). Beverages Alcohol. Sugar-sweetened beverages, such as sodas, teas, or energy drinks. Seasoning  and other foods Homemade fermented foods and drinks, such as pickles, sauerkraut, or kombucha drinks. (Store-bought pasteurized versions of these are okay.) Salads that are made in a store or deli, such as ham salad, chicken salad, egg salad, tuna salad, and seafood salad. The items listed above may not be a complete list of foods and beverages to avoid. Contact your dietitian for more information. Where to find more information To calculate the number of calories you need based on your height, weight, and activity level, you can use an online calculator such as:  MobileTransition.ch To calculate how much weight you should gain during pregnancy, you can use an online pregnancy weight gain calculator such as:  Streaming[employer].com.cy Summary  While you are pregnant, your body requires additional nutrition to help support your growing baby.  Eat a variety of foods, especially fruits and vegetables to get a full range of vitamins and minerals.  Practice good food safety and cleanliness. Wash your hands before you eat and after you prepare raw meat. Wash all fruits and vegetables well before peeling or eating. Taking these actions can help to prevent food-borne  illnesses, such as listeriosis, that can be very dangerous to your baby.  Do not eat raw meat or fish. Do not eat fish that have high mercury content, such as tilefish, shark, swordfish, and king mackerel. Do not eat unpasteurized (raw) dairy.  Take a prenatal vitamin to help meet your additional vitamin and mineral needs during pregnancy, specifically for folic acid, iron, calcium, and vitamin D. This information is not intended to replace advice given to you by your health care provider. Make sure you discuss any questions you have with your health care provider. Document Released: 03/07/2014 Document Revised: 02/17/2017 Document Reviewed: 02/17/2017 Elsevier Interactive Patient Education  2019 [employer] WHAT OB PATIENTS CAN EXPECT   Confirmation of pregnancy and ultrasound ordered if medically indicated-[redacted] weeks gestation  New OB (NOB) intake with nurse and New OB (NOB) labs- [redacted] weeks gestation  New OB (NOB) physical examination with provider- 11/[redacted] weeks gestation  Flu vaccine-[redacted] weeks gestation  Anatomy scan-[redacted] weeks gestation  Glucose tolerance test, blood work to test for anemia, T-dap vaccine-[redacted] weeks gestation  Vaginal swabs/cultures-STD/Group B strep-[redacted] weeks gestation  Appointments every 4 weeks until 28 weeks  Every 2 weeks from 28 weeks until 36 weeks  Weekly visits from 36 weeks until delivery  Common Medications Safe in Pregnancy  Acne:      Constipation:  Benzoyl Peroxide     Colace  Clindamycin      Dulcolax Suppository  Topica Erythromycin     Fibercon  Salicylic Acid      Metamucil         Miralax AVOID:        Senakot   Accutane    Cough:  Retin-A       Cough Drops  Tetracycline      Phenergan w/ Codeine if Rx  Minocycline      Robitussin (Plain & DM)  Antibiotics:     Crabs/Lice:  Ceclor       RID  Cephalosporins    AVOID:  E-Mycins      Kwell  Keflex  Macrobid/Macrodantin   Diarrhea:  Penicillin      Kao-Pectate  Zithromax      Imodium AD          PUSH FLUIDS AVOID:       Cipro     Fever:  Tetracycline  Tylenol (Regular or Extra  Minocycline       Strength)  Levaquin      Extra Strength-Do not          Exceed 8 tabs/24 hrs Caffeine:        '200mg'$ /day (equiv. To 1 cup of coffee or  approx. 3 12 oz sodas)         Gas: Cold/Hayfever:       Gas-X  Benadryl      Mylicon  Claritin       Phazyme  **Claritin-D        Chlor-Trimeton    Headaches:  Dimetapp      ASA-Free Excedrin  Drixoral-Non-Drowsy     Cold Compress  Mucinex (Guaifenasin)     Tylenol (Regular or Extra  Sudafed/Sudafed-12 Hour     Strength)  **Sudafed PE Pseudoephedrine   Tylenol Cold & Sinus     Vicks Vapor Rub  Zyrtec  **AVOID if Problems With Blood Pressure         Heartburn: Avoid lying down for at least 1 hour after meals  Aciphex      Maalox     Rash:  Milk of Magnesia     Benadryl    Mylanta       1% Hydrocortisone Cream  Pepcid  Pepcid Complete   Sleep Aids:  Prevacid      Ambien   Prilosec       Benadryl  Rolaids       Chamomile Tea  Tums (Limit 4/day)     Unisom  Zantac       Tylenol PM         Warm milk-add vanilla or  Hemorrhoids:       Sugar for taste  Anusol/Anusol H.C.  (RX: Analapram 2.5%)  Sugar Substitutes:  Hydrocortisone OTC     Ok in moderation  Preparation H      Tucks        Vaseline lotion applied to tissue with wiping    Herpes:     Throat:  Acyclovir      Oragel  Famvir  Valtrex     Vaccines:         Flu Shot Leg Cramps:       *Gardasil  Benadryl      Hepatitis A         Hepatitis B Nasal Spray:       Pneumovax  Saline Nasal Spray     Polio Booster         Tetanus Nausea:       Tuberculosis test or PPD  Vitamin B6 25 mg TID   AVOID:    Dramamine      *Gardasil  Emetrol       Live Poliovirus  Ginger Root 250 mg QID    MMR (measles, mumps &  High Complex Carbs @ Bedtime    rebella)  Sea Bands-Accupressure    Varicella (Chickenpox)  Unisom 1/2 tab TID     *No known complications           If  received before Pain:         Known pregnancy;   Darvocet       Resume series after  Lortab        Delivery  Percocet    Yeast:   Tramadol      Femstat  Tylenol 3      Gyne-lotrimin  Ultram       Monistat  Vicodin           MISC:         All Sunscreens           Hair Coloring/highlights          Insect Repellant's          (Including DEET)         Mystic Tans First Trimester of Pregnancy  The first trimester of pregnancy is from week 1 until the end of week 13 (months 1 through 3). During this time, your baby will begin to develop inside you. At 6-8 weeks, the eyes and face are formed, and the heartbeat can be seen on ultrasound. At the end of 12 weeks, all the baby's organs are formed. Prenatal care is all the medical care you receive before the birth of your baby. Make sure you get good prenatal care and follow all of your doctor's instructions. Follow these instructions at home: Medicines  Take over-the-counter and prescription medicines only as told by your doctor. Some medicines are safe and some medicines are not safe during pregnancy.  Take a prenatal vitamin that contains at least 600 micrograms (mcg) of folic acid.  If you have trouble pooping (constipation), take medicine that will make your stool soft (stool softener) if your doctor approves. Eating and drinking   Eat regular, healthy meals.  Your doctor will tell you the amount of weight gain that is right for you.  Avoid raw meat and uncooked cheese.  If you feel sick to your stomach (nauseous) or throw up (vomit): ? Eat 4 or 5 small meals a day instead of 3 large meals. ? Try eating a few soda crackers. ? Drink liquids between meals instead of during meals.  To prevent constipation: ? Eat foods that are high in fiber, like fresh fruits and vegetables, whole grains, and beans. ? Drink enough fluids to keep your pee (urine) clear or pale yellow. Activity  Exercise only as told by your doctor. Stop exercising  if you have cramps or pain in your lower belly (abdomen) or low back.  Do not exercise if it is too hot, too humid, or if you are in a place of great height (high altitude).  Try to avoid standing for long periods of time. Move your legs often if you must stand in one place for a long time.  Avoid heavy lifting.  Wear low-heeled shoes. Sit and stand up straight.  You can have sex unless your doctor tells you not to. Relieving pain and discomfort  Wear a good support bra if your breasts are sore.  Take warm water baths (sitz baths) to soothe pain or discomfort caused by hemorrhoids. Use hemorrhoid cream if your doctor says it is okay.  Rest with your legs raised if you have leg cramps or low back pain.  If you have puffy, bulging veins (varicose veins) in your legs: ? Wear support hose or compression stockings as told by your doctor. ? Raise (elevate) your feet for 15 minutes, 3-4 times a day. ? Limit salt in your food. Prenatal care  Schedule your prenatal visits by the twelfth week of pregnancy.  Write down your questions. Take them to your prenatal visits.  Keep all your prenatal visits as told by your doctor. This is important. Safety  Wear your seat belt at all times when driving.  Make a list of emergency phone numbers. The list should include numbers for family, friends, the hospital, and police and  Public relations account executive. General instructions  Ask your doctor for a referral to a local prenatal class. Begin classes no later than at the start of month 6 of your pregnancy.  Ask for help if you need counseling or if you need help with nutrition. Your doctor can give you advice or tell you where to go for help.  Do not use hot tubs, steam rooms, or saunas.  Do not douche or use tampons or scented sanitary pads.  Do not cross your legs for long periods of time.  Avoid all herbs and alcohol. Avoid drugs that are not approved by your doctor.  Do not use any tobacco products,  including cigarettes, chewing tobacco, and electronic cigarettes. If you need help quitting, ask your doctor. You may get counseling or other support to help you quit.  Avoid cat litter boxes and soil used by cats. These carry germs that can cause birth defects in the baby and can cause a loss of your baby (miscarriage) or stillbirth.  Visit your dentist. At home, brush your teeth with a soft toothbrush. Be gentle when you floss. Contact a doctor if:  You are dizzy.  You have mild cramps or pressure in your lower belly.  You have a nagging pain in your belly area.  You continue to feel sick to your stomach, you throw up, or you have watery poop (diarrhea).  You have a bad smelling fluid coming from your vagina.  You have pain when you pee (urinate).  You have increased puffiness (swelling) in your face, hands, legs, or ankles. Get help right away if:  You have a fever.  You are leaking fluid from your vagina.  You have spotting or bleeding from your vagina.  You have very bad belly cramping or pain.  You gain or lose weight rapidly.  You throw up blood. It may look like coffee grounds.  You are around people who have [place] measles, fifth disease, or chickenpox.  You have a very bad headache.  You have shortness of breath.  You have any kind of trauma, such as from a fall or a car accident. Summary  The first trimester of pregnancy is from week 1 until the end of week 13 (months 1 through 3).  To take care of yourself and your unborn baby, you will need to eat healthy meals, take medicines only if your doctor tells you to do so, and do activities that are safe for you and your baby.  Keep all follow-up visits as told by your doctor. This is important as your doctor will have to ensure that your baby is healthy and growing well. This information is not intended to replace advice given to you by your health care provider. Make sure you discuss any questions you have with  your health care provider. Document Released: 11/09/2007 Document Revised: 05/31/2016 Document Reviewed: 05/31/2016 Elsevier Interactive Patient Education  2019 [employer].

## 2018-11-08 NOTE — Progress Notes (Signed)
NEW OB HISTORY AND PHYSICAL  SUBJECTIVE:       Brenda Rogers is a 33 y.o. [id number] female, Patient's last menstrual period was 08/14/2018 (exact date)., Estimated Date of Delivery: 05/26/19, [redacted]w[redacted]d, presents today for establishment of Prenatal Care.  Endorses nausea without vomiting and intermittent breast tenderness.   Denies difficulty breathing or respiratory distress, chest pain, abdominal pain, vaginal bleeding, dysuria, and leg pain or swelling.   Desires midwifery care. Taking prenatal vitamin. Request AFP in second trimester.   History significant for placenta previa.    Gynecologic History  Patient's last menstrual period was 08/14/2018 (exact date).  Period Duration (Days): 7 Period Pattern: (!) Irregular Menstrual Flow: Light, Heavy Menstrual Control: Thin pad, Other (Comment)(Blossom cup) Dysmenorrhea: (!) Mild Dysmenorrhea Symptoms: Cramping  Contraception: none  Last Pap: 2018. Results were: normal  Obstetric History OB History  Gravida Para Term Preterm AB Living  2 1 0 1 0 1  SAB TAB Ectopic Multiple Live Births  0 0 0 0 1    # Outcome Date GA Lbr Len/2nd Weight Sex Delivery Anes PTL Lv  2 Current           1 Preterm 08/30/08   4 lb (1.814 kg) F Vag-Spont  N LIV     Complications: Placenta Previa    Past Medical History:  Diagnosis Date  . Placenta previa   . Sickle cell trait (HCC)     No past surgical history on file.  Current Outpatient Medications on File Prior to Visit  Medication Sig Dispense Refill  . Prenatal Vit-Fe Fumarate-FA (MULTIVITAMIN-PRENATAL) 27-0.8 MG TABS tablet Take 1 tablet by mouth daily at 12 noon.     No current facility-administered medications on file prior to visit.     No Known Allergies  Social History   Socioeconomic History  . Marital status: Married    Spouse name: Not on file  . Number of children: Not on file  . Years of education: Not on file  . Highest education level: Not on file  Occupational History   . Not on file  Social Needs  . Financial resource strain: Not on file  . Food insecurity:    Worry: Not on file    Inability: Not on file  . Transportation needs:    Medical: Not on file    Non-medical: Not on file  Tobacco Use  . Smoking status: Never Smoker  . Smokeless tobacco: Never Used  Substance and Sexual Activity  . Alcohol use: Never    Frequency: Never  . Drug use: Never  . Sexual activity: Yes    Birth control/protection: None  Lifestyle  . Physical activity:    Days per week: Not on file    Minutes per session: Not on file  . Stress: Not on file  Relationships  . Social connections:    Talks on phone: Not on file    Gets together: Not on file    Attends religious service: Not on file    Active member of club or organization: Not on file    Attends meetings of clubs or organizations: Not on file    Relationship status: Not on file  . Intimate partner violence:    Fear of current or ex partner: Not on file    Emotionally abused: Not on file    Physically abused: Not on file    Forced sexual activity: Not on file  Other Topics Concern  . Not on file  Social History Narrative  .  Not on file    Family History  Problem Relation Age of Onset  . Breast cancer Neg Hx   . Ovarian cancer Neg Hx   . Colon cancer Neg Hx     The following portions of the patient's history were reviewed and updated as appropriate: allergies, current medications, past OB history, past medical history, past surgical history, past family history, past social history, and problem list.  Review of Systems  ROS negative except as noted above. Information obtained from patient.   OBJECTIVE:  BP 111/76   Pulse (!) 104   Wt 132 lb 4.8 oz (60 kg)   LMP 08/14/2018 (Exact Date)   BMI 27.18 kg/m   Initial Physical Exam (New OB)  GENERAL APPEARANCE: alert, well appearing, in no apparent distress  HEAD: normocephalic, atraumatic  MOUTH: mucous membranes moist, pharynx normal  without lesions  THYROID: no thyromegaly or masses present  BREASTS: no masses noted, no significant tenderness, no palpable axillary nodes, no skin changes  LUNGS: clear to auscultation, no wheezes, rales or rhonchi, symmetric air entry  HEART: regular rate and rhythm, no murmurs  ABDOMEN: soft, nontender, nondistended, no abnormal masses, no epigastric pain and FHT present  EXTREMITIES: no redness or tenderness in the calves or thighs, no edema  SKIN: normal coloration and turgor, no rashes  LYMPH NODES: no adenopathy palpable  NEUROLOGIC: alert, oriented, normal speech, no focal findings or movement disorder noted  PELVIC EXAM: Deferred, no complaints  ASSESSMENT: Normal pregnancy Rh positive Desires AFP next visit History of resolving placenta previa  PLAN: Prenatal care New OB counseling: The patient has been given an overview regarding routine prenatal care. Recommendations regarding diet, weight gain, and exercise in pregnancy were given. Prenatal testing, optional genetic testing, and ultrasound use in pregnancy were reviewed.  Benefits of Breast Feeding were discussed. The patient is encouraged to consider nursing her baby post partum. See orders

## 2018-12-10 ENCOUNTER — Telehealth: Payer: Self-pay

## 2018-12-10 NOTE — Telephone Encounter (Signed)
Coronavirus (COVID-19) Are you at risk?  Are you at risk for the Coronavirus (COVID-19)?  To be considered HIGH RISK for Coronavirus (COVID-19), you have to meet the following criteria:  . Traveled to [place], [place], [place], [place] or [place]; or in the [place] to [place], [place], [place], or [place]; and have fever, cough, and shortness of breath within the last 2 weeks of travel OR . Been in close contact with a person diagnosed with COVID-19 within the last 2 weeks and have fever, cough, and shortness of breath . IF YOU DO NOT MEET THESE CRITERIA, YOU ARE CONSIDERED LOW RISK FOR COVID-19.  What to do if you are HIGH RISK for COVID-19?  . If you are having a medical emergency, call 911. . Seek medical care right away. Before you go to a doctor's office, urgent care or emergency department, call ahead and tell them about your recent travel, contact with someone diagnosed with COVID-19, and your symptoms. You should receive instructions from your physician's office regarding next steps of care.  . When you arrive at healthcare provider, tell the healthcare staff immediately you have returned from visiting [place], [place], [place], [place] or [place]; or traveled in the [place] to [place], [place], [place], or [place]; in the last two weeks or you have been in close contact with a person diagnosed with COVID-19 in the last 2 weeks.   . Tell the health care staff about your symptoms: fever, cough and shortness of breath. . After you have been seen by a medical provider, you will be either: o Tested for (COVID-19) and discharged home on quarantine except to seek medical care if symptoms worsen, and asked to  - Stay home and avoid contact with others until you get your results (4-5 days)  - Avoid travel on public transportation if possible (such as bus, train, or airplane) or o Sent to the Emergency Department by EMS for evaluation, COVID-19 testing, and possible  admission depending on your condition and test results.  What to do if you are LOW RISK for COVID-19?  Reduce your risk of any infection by using the same precautions used for avoiding the common cold or flu:  . Wash your hands often with soap and warm water for at least 20 seconds.  If soap and water are not readily available, use an alcohol-based hand sanitizer with at least 60% alcohol.  . If coughing or sneezing, cover your mouth and nose by coughing or sneezing into the elbow areas of your shirt or coat, into a tissue or into your sleeve (not your hands). . Avoid shaking hands with others and consider head nods or verbal greetings only. . Avoid touching your eyes, nose, or mouth with unwashed hands.  . Avoid close contact with people who are Shota Kohrs. . Avoid places or events with large numbers of people in one location, like concerts or sporting events. . Carefully consider travel plans you have or are making. . If you are planning any travel outside or inside the [place], visit the CDC's Travelers' Health webpage for the latest health notices. . If you have some symptoms but not all symptoms, continue to monitor at home and seek medical attention if your symptoms worsen. . If you are having a medical emergency, call 911.  12/10/18 SCREENING NEG SLS ADDITIONAL HEALTHCARE OPTIONS FOR PATIENTS  [facility] / e-Visit: [email or web address]         [facility]: [phone]     Urgent Care: [phone]                   [facility]: [phone]  

## 2018-12-11 ENCOUNTER — Encounter: Payer: Self-pay | Admitting: Certified Nurse Midwife

## 2018-12-11 ENCOUNTER — Other Ambulatory Visit: Payer: Self-pay

## 2018-12-11 ENCOUNTER — Ambulatory Visit (INDEPENDENT_AMBULATORY_CARE_PROVIDER_SITE_OTHER): Payer: Self-pay | Admitting: Certified Nurse Midwife

## 2018-12-11 VITALS — BP 133/99 | HR 99 | Wt 136.6 lb

## 2018-12-11 DIAGNOSIS — Z3481 Encounter for supervision of other normal pregnancy, first trimester: Secondary | ICD-10-CM

## 2018-12-11 LAB — POCT URINALYSIS DIPSTICK OB
Bilirubin, UA: NEGATIVE
Glucose, UA: NEGATIVE
Ketones, UA: NEGATIVE
Leukocytes, UA: NEGATIVE
Nitrite, UA: NEGATIVE
POC,PROTEIN,UA: NEGATIVE
Spec Grav, UA: >=1.03 — AB (ref 1.010–1.025)
Urobilinogen, UA: 0.2 E.U./dL
pH, UA: 5 (ref 5.0–8.0)

## 2018-12-11 NOTE — Patient Instructions (Signed)

## 2018-12-11 NOTE — Progress Notes (Signed)
ROB doing well. No complaints. Discussed anatomy u/s next visit. Pt verbalizes and agrees to plan of care. Round ligament pain reviewed. Encouraged use of tylenol and belly band as needed. Follow up 4 wks with Melody .   Philip Aspen, CNM

## 2018-12-12 ENCOUNTER — Other Ambulatory Visit: Payer: Self-pay | Admitting: Certified Nurse Midwife

## 2018-12-12 DIAGNOSIS — O285 Abnormal chromosomal and genetic finding on antenatal screening of mother: Secondary | ICD-10-CM

## 2018-12-12 LAB — AFP TUMOR MARKER: AFP, Serum, Tumor Marker: 51.8 ng/mL — ABNORMAL HIGH (ref 0.0–8.3)

## 2018-12-14 ENCOUNTER — Encounter: Payer: Self-pay | Admitting: Certified Nurse Midwife

## 2018-12-17 ENCOUNTER — Telehealth: Payer: Self-pay | Admitting: Obstetrics and Gynecology

## 2018-12-17 ENCOUNTER — Telehealth: Payer: Self-pay

## 2018-12-17 NOTE — Telephone Encounter (Signed)
The patient called and stated that she has a few questions about her appointment at [facility] and is hoping to speak with a nurse. Please advise.

## 2018-12-17 NOTE — Telephone Encounter (Signed)
I spoke with Ms. Ahuja's provider, Philip Aspen, CNM regarding the referral to [facility] for genetic counseling due to an abnormal AFP.  Upon chart review, it was noted that an AFP tumor marker lab was ordered rather than maternal serum AFP or an AFP tetra (quad screen).  The tumor marker AFP cannot assess for open neural tube defects or chromosome conditions in the pregnancy.  If screening is desired for neural tube defects, then a maternal serum AFP only should be ordered and if testing is desired for neural tube defects, Down syndrome and trisomy 18, then the AFP tetra (quad screen) should be ordered. We will cancel the appointment and can reschedule if desired after additional testing. We may be reached at [phone].  Wilburt Finlay, MS, CGC

## 2018-12-17 NOTE — Telephone Encounter (Signed)
-----   Message from Philip Aspen, [place] sent at 12/17/2018  1:59 PM EDT ----- Ivin Booty,  Can you please contact Mrs [place] and let her know that the AFP /quad screen was the incorrect lab that was done. It was for the tumor marker. And that will be elevated in pregnancy. We need to re draw for the correct test- The second trimester quad screen. Please contact her and let her know that her visit for genetic counseling has been canceled and we need to redraw. The order for the lab was incorrect...not sure what happened.   Thanks,  Deneise Lever

## 2018-12-17 NOTE — Telephone Encounter (Signed)
Patient was contacted and ATs message was given. Patient was very understanding and is agreeable to a redraw as soon as possible. A lab appointment was made and patient was contacted. AT aware.

## 2018-12-18 ENCOUNTER — Other Ambulatory Visit: Payer: Self-pay

## 2018-12-18 ENCOUNTER — Other Ambulatory Visit: Payer: Self-pay | Admitting: Certified Nurse Midwife

## 2018-12-24 ENCOUNTER — Ambulatory Visit: Payer: Self-pay

## 2018-12-25 LAB — AFP TETRA
DIA Mom Value: 1.79
DIA Value (EIA): 312.21 pg/mL
DSR (By Age)    1 IN: 578
DSR (Second Trimester) 1 IN: 1453
Gestational Age: 17.3 WEEKS
MSAFP Mom: 1.29
MSAFP: 53 ng/mL
MSHCG Mom: 1.38
MSHCG: 47843 m[IU]/mL
Maternal Age At EDD: 31.4 yr
Osb Risk: 4947
T18 (By Age): 1:2253 {titer}
Test Results:: NEGATIVE
Weight: 136 [lb_av]
uE3 Mom: 1.08
uE3 Value: 1.32 ng/mL

## 2019-01-08 ENCOUNTER — Encounter: Payer: Self-pay | Admitting: Certified Nurse Midwife

## 2019-01-08 ENCOUNTER — Other Ambulatory Visit: Payer: Self-pay

## 2019-01-11 ENCOUNTER — Telehealth: Payer: Self-pay | Admitting: Certified Nurse Midwife

## 2019-01-11 NOTE — Telephone Encounter (Signed)
The patient called and stated that [facility] is unable to issue her a refund bc she was informed that the issue was on our end. The patient stated that her provider will need to call [facility] and request a refund to the patient and update/change an order/information. Pt is requesting a call back. Please advise.

## 2019-01-14 NOTE — Telephone Encounter (Signed)
Brenda Rogers who do I call or what do I need to do. This was an error on ordering the test on my  part. She wanted the AFP testing . I think I ordered the test ( normally Ivin Booty puts it in for me). The order if put in was AFP it came up as AFT but resulted AFP tumor marker.   Thanks  Deneise Lever

## 2019-01-14 NOTE — Telephone Encounter (Signed)
When was this? 7/72020??   Are you going to redraw her at her next visit? Of is it too late?

## 2019-01-15 ENCOUNTER — Other Ambulatory Visit: Payer: Self-pay

## 2019-01-15 ENCOUNTER — Ambulatory Visit (INDEPENDENT_AMBULATORY_CARE_PROVIDER_SITE_OTHER): Payer: Self-pay

## 2019-01-15 ENCOUNTER — Ambulatory Visit (INDEPENDENT_AMBULATORY_CARE_PROVIDER_SITE_OTHER): Payer: Self-pay | Admitting: Obstetrics and Gynecology

## 2019-01-15 ENCOUNTER — Encounter: Payer: Self-pay | Admitting: Obstetrics and Gynecology

## 2019-01-15 ENCOUNTER — Other Ambulatory Visit: Payer: Self-pay | Admitting: Certified Nurse Midwife

## 2019-01-15 VITALS — BP 130/86 | HR 88 | Wt 140.9 lb

## 2019-01-15 DIAGNOSIS — Z3492 Encounter for supervision of normal pregnancy, unspecified, second trimester: Secondary | ICD-10-CM

## 2019-01-15 DIAGNOSIS — Z3A21 21 weeks gestation of pregnancy: Secondary | ICD-10-CM

## 2019-01-15 LAB — POCT URINALYSIS DIPSTICK OB
Bilirubin, UA: NEGATIVE
Blood, UA: NEGATIVE
Glucose, UA: NEGATIVE
Ketones, UA: NEGATIVE
Leukocytes, UA: NEGATIVE
Nitrite, UA: NEGATIVE
POC,PROTEIN,UA: NEGATIVE
Spec Grav, UA: 1.015 (ref 1.010–1.025)
Urobilinogen, UA: 0.2 E.U./dL
pH, UA: 6 (ref 5.0–8.0)

## 2019-01-15 NOTE — Progress Notes (Signed)
ROB and anatomy scan- doing well.   Reviewed normal anatomy scan below:  Indications:Anatomy Ultrasound Findings:  Singleton intrauterine pregnancy is visualized with FHR at 157 BPM. Biometrics give an (U/S) Gestational age of [redacted]w[redacted]d and an (U/S) EDD of 06/02/2019; this correlates with the clinically established Estimated Date of Delivery: 05/26/19  Fetal presentation is Breech.  EFW: 328 g ( 12 oz). Placenta: fundal. Grade: 1  AFI: subjectively normal.  Anatomic survey is complete and normal; Gender - surprise.    Right Ovary is normal in appearance. Left Ovary is normal appearance. Survey of the adnexa demonstrates no adnexal masses. There is no free peritoneal fluid in the cul de sac.  Impression: 1. [redacted]w[redacted]d Viable Singleton Intrauterine pregnancy by U/S. 2. (U/S) EDD is consistent with Clinically established Estimated Date of Delivery: 05/26/19 . 3. Normal Anatomy Scan

## 2019-01-15 NOTE — Progress Notes (Signed)
ROB- anatomy scan done today, pt is doing well 

## 2019-01-15 NOTE — Telephone Encounter (Signed)
Let me reach out to our [facility] acct person(Big Wille Glaser) and see what I can do. Thanks.

## 2019-01-15 NOTE — Telephone Encounter (Signed)
She has already had it done. Look like I just need to get the bill taken care of for the incorrect lab.   Thanks,  Deneise Lever

## 2019-01-25 NOTE — Telephone Encounter (Signed)
Big Wille Glaser is no longer with [facility]. Faylene Kurtz is taking care of his accounts until further notice.  I have emailed him all the information to credit this pts account. He will send me an email once completed. ty  CM

## 2019-01-25 NOTE — Telephone Encounter (Signed)
Thank you. I appreciate it. 

## 2019-01-28 NOTE — Telephone Encounter (Signed)
Pt aware info has been sent to [place] S[place] with [facility]. He is to credit her acct.   Advised pt to see credit in about 30 days.

## 2019-02-14 ENCOUNTER — Ambulatory Visit (INDEPENDENT_AMBULATORY_CARE_PROVIDER_SITE_OTHER): Payer: Self-pay | Admitting: Certified Nurse Midwife

## 2019-02-14 ENCOUNTER — Other Ambulatory Visit: Payer: Self-pay

## 2019-02-14 VITALS — BP 143/81 | HR 94 | Wt 146.3 lb

## 2019-02-14 DIAGNOSIS — Z130 Encounter for screening for diseases of the blood and blood-forming organs and certain disorders involving the immune mechanism: Secondary | ICD-10-CM

## 2019-02-14 DIAGNOSIS — Z3492 Encounter for supervision of normal pregnancy, unspecified, second trimester: Secondary | ICD-10-CM

## 2019-02-14 DIAGNOSIS — Z131 Encounter for screening for diabetes mellitus: Secondary | ICD-10-CM

## 2019-02-14 DIAGNOSIS — Z113 Encounter for screening for infections with a predominantly sexual mode of transmission: Secondary | ICD-10-CM

## 2019-02-14 LAB — POCT URINALYSIS DIPSTICK OB
Bilirubin, UA: NEGATIVE
Blood, UA: NEGATIVE
Glucose, UA: NEGATIVE
Ketones, UA: NEGATIVE
Leukocytes, UA: NEGATIVE
Nitrite, UA: NEGATIVE
POC,PROTEIN,UA: NEGATIVE
Spec Grav, UA: 1.025 (ref 1.010–1.025)
Urobilinogen, UA: 0.2 E.U./dL
pH, UA: 5 (ref 5.0–8.0)

## 2019-02-14 NOTE — Addendum Note (Signed)
Addended by: Cherre Huger on: 02/14/2019 06:28 PM   Modules accepted: Orders

## 2019-02-14 NOTE — Progress Notes (Signed)
ROB-Doing well, no questions or concerns. Repeat blood pressure 136/78. Remembers having blood pressure taken twice during visits in last pregnancy. [facility] pamphlet given. Anticipatory guidance regarding 28 week labs and course of prenatal care. Reviewed red flag symptoms and when to call. RTC x 3 weeks for labs, TDaP, and ROB or sooner if needed.

## 2019-02-14 NOTE — Patient Instructions (Addendum)
Third Trimester of Pregnancy  The third trimester is from week 28 through week 40 (months 7 through 9). This trimester is when your unborn baby (fetus) is growing very fast. At the end of the ninth month, the unborn baby is about 20 inches in length. It weighs about 6-10 pounds. Follow these instructions at home: Medicines  Take over-the-counter and prescription medicines only as told by your doctor. Some medicines are safe and some medicines are not safe during pregnancy.  Take a prenatal vitamin that contains at least 600 micrograms (mcg) of folic acid.  If you have trouble pooping (constipation), take medicine that will make your stool soft (stool softener) if your doctor approves. Eating and drinking   Eat regular, healthy meals.  Avoid raw meat and uncooked cheese.  If you get low calcium from the food you eat, talk to your doctor about taking a daily calcium supplement.  Eat four or five small meals rather than three large meals a day.  Avoid foods that are high in fat and sugars, such as fried and sweet foods.  To prevent constipation: ? Eat foods that are high in fiber, like fresh fruits and vegetables, whole grains, and beans. ? Drink enough fluids to keep your pee (urine) clear or pale yellow. Activity  Exercise only as told by your doctor. Stop exercising if you start to have cramps.  Avoid heavy lifting, wear low heels, and sit up straight.  Do not exercise if it is too hot, too humid, or if you are in a place of great height (high altitude).  You may continue to have sex unless your doctor tells you not to. Relieving pain and discomfort  Wear a good support bra if your breasts are tender.  Take frequent breaks and rest with your legs raised if you have leg cramps or low back pain.  Take warm water baths (sitz baths) to soothe pain or discomfort caused by hemorrhoids. Use hemorrhoid cream if your doctor approves.  If you develop puffy, bulging veins (varicose  veins) in your legs: ? Wear support hose or compression stockings as told by your doctor. ? Raise (elevate) your feet for 15 minutes, 3-4 times a day. ? Limit salt in your food. Safety  Wear your seat belt when driving.  Make a list of emergency phone numbers, including numbers for family, friends, the hospital, and police and fire departments. Preparing for your baby's arrival To prepare for the arrival of your baby:  Take prenatal classes.  Practice driving to the hospital.  Visit the hospital and tour the maternity area.  Talk to your work about taking leave once the baby comes.  Pack your hospital bag.  Prepare the baby's room.  Go to your doctor visits.  Buy a rear-facing car seat. Learn how to install it in your car. General instructions  Do not use hot tubs, steam rooms, or saunas.  Do not use any products that contain nicotine or tobacco, such as cigarettes and e-cigarettes. If you need help quitting, ask your doctor.  Do not drink alcohol.  Do not douche or use tampons or scented sanitary pads.  Do not cross your legs for long periods of time.  Do not travel for long distances unless you must. Only do so if your doctor says it is okay.  Visit your dentist if you have not gone during your pregnancy. Use a soft toothbrush to brush your teeth. Be gentle when you floss.  Avoid cat litter boxes and soil   used by cats. These carry germs that can cause birth defects in the baby and can cause a loss of your baby (miscarriage) or stillbirth.  Keep all your prenatal visits as told by your doctor. This is important. Contact a doctor if:  You are not sure if you are in labor or if your water has broken.  You are dizzy.  You have mild cramps or pressure in your lower belly.  You have a nagging pain in your belly area.  You continue to feel sick to your stomach, you throw up, or you have watery poop.  You have bad smelling fluid coming from your vagina.  You have  pain when you pee. Get help right away if:  You have a fever.  You are leaking fluid from your vagina.  You are spotting or bleeding from your vagina.  You have severe belly cramps or pain.  You lose or gain weight quickly.  You have trouble catching your breath and have chest pain.  You notice sudden or extreme puffiness (swelling) of your face, hands, ankles, feet, or legs.  You have not felt the baby move in over an hour.  You have severe headaches that do not go away with medicine.  You have trouble seeing.  You are leaking, or you are having a gush of fluid, from your vagina before you are 37 weeks.  You have regular belly spasms (contractions) before you are 37 weeks. Summary  The third trimester is from week 28 through week 40 (months 7 through 9). This time is when your unborn baby is growing very fast.  Follow your doctor's advice about medicine, food, and activity.  Get ready for the arrival of your baby by taking prenatal classes, getting all the baby items ready, preparing the baby's room, and visiting your doctor to be checked.  Get help right away if you are bleeding from your vagina, or you have chest pain and trouble catching your breath, or if you have not felt your baby move in over an hour. This information is not intended to replace advice given to you by your health care provider. Make sure you discuss any questions you have with your health care provider. Document Released: 08/17/2009 Document Revised: 09/13/2018 Document Reviewed: 06/28/2016 Elsevier Patient Education  [place]. WHAT OB PATIENTS CAN EXPECT   Confirmation of pregnancy and ultrasound ordered if medically indicated-[redacted] weeks gestation  New OB (NOB) intake with nurse and New OB (NOB) labs- [redacted] weeks gestation  New OB (NOB) physical examination with provider- 11/[redacted] weeks gestation  Flu vaccine-[redacted] weeks gestation  Anatomy scan-[redacted] weeks gestation  Glucose tolerance test, blood  work to test for anemia, T-dap vaccine-[redacted] weeks gestation  Vaginal swabs/cultures-STD/Group B strep-[redacted] weeks gestation  Appointments every 4 weeks until 28 weeks  Every 2 weeks from 28 weeks until 36 weeks  Weekly visits from 36 weeks until delivery  Back Pain in Pregnancy Back pain during pregnancy is common. Back pain may be caused by several factors that are related to changes during your pregnancy. Follow these instructions at home: Managing pain, stiffness, and swelling      If directed, for sudden (acute) back pain, put ice on the painful area. ? Put ice in a plastic bag. ? Place a towel between your skin and the bag. ? Leave the ice on for 20 minutes, 2-3 times per day.  If directed, apply heat to the affected area before you exercise. Use the heat source that your  health care provider recommends, such as a moist heat pack or a heating pad. ? Place a towel between your skin and the heat source. ? Leave the heat on for 20-30 minutes. ? Remove the heat if your skin turns bright red. This is especially important if you are unable to feel pain, heat, or cold. You may have a greater risk of getting burned.  If directed, massage the affected area. Activity  Exercise as told by your health care provider. Gentle exercise is the best way to prevent or manage back pain.  Listen to your body when lifting. If lifting hurts, ask for help or bend your knees. This uses your leg muscles instead of your back muscles.  Squat down when picking up something from the floor. Do not bend over.  Only use bed rest for short periods as told by your health care provider. Bed rest should only be used for the most severe episodes of back pain. Standing, sitting, and lying down  Do not stand in one place for long periods of time.  Use good posture when sitting. Make sure your head rests over your shoulders and is not hanging forward. Use a pillow on your lower back if necessary.  Try sleeping on  your side, preferably the left side, with a pregnancy support pillow or 1-2 regular pillows between your legs. ? If you have back pain after a night's rest, your bed may be too soft. ? A firm mattress may provide more support for your back during pregnancy. General instructions  Do not wear high heels.  Eat a healthy diet. Try to gain weight within your health care provider's recommendations.  Use a maternity girdle, elastic sling, or back brace as told by your health care provider.  Take over-the-counter and prescription medicines only as told by your health care provider.  Work with a physical therapist or massage therapist to find ways to manage back pain. Acupuncture or massage therapy may be helpful.  Keep all follow-up visits as told by your health care provider. This is important. Contact a health care provider if:  Your back pain interferes with your daily activities.  You have increasing pain in other parts of your body. Get help right away if:  You develop numbness, tingling, weakness, or problems with the use of your arms or legs.  You develop severe back pain that is not controlled with medicine.  You have a change in bowel or bladder control.  You develop shortness of breath, dizziness, or you faint.  You develop nausea, vomiting, or sweating.  You have back pain that is a rhythmic, cramping pain similar to labor pains. Labor pain is usually 1-2 minutes apart, lasts for about 1 minute, and involves a bearing down feeling or pressure in your pelvis.  You have back pain and your water breaks or you have vaginal bleeding.  You have back pain or numbness that travels down your leg.  Your back pain developed after you fell.  You develop pain on one side of your back.  You see blood in your urine.  You develop skin blisters in the area of your back pain. Summary  Back pain may be caused by several factors that are related to changes during your pregnancy.   Follow instructions as told by your health care provider for managing pain, stiffness, and swelling.  Exercise as told by your health care provider. Gentle exercise is the best way to prevent or manage back pain.  Take over-the-counter  and prescription medicines only as told by your health care provider.  Keep all follow-up visits as told by your health care provider. This is important. This information is not intended to replace advice given to you by your health care provider. Make sure you discuss any questions you have with your health care provider. Document Released: 08/31/2005 Document Revised: 09/11/2018 Document Reviewed: 11/08/2017 Elsevier Patient Education  2020 Elsevier Inc. Fetal Movement Counts Patient Name: ________________________________________________ Patient Due Date: ____________________ What is a fetal movement count?  A fetal movement count is the number of times that you feel your baby move during a certain amount of time. This may also be called a fetal kick count. A fetal movement count is recommended for every pregnant woman. You may be asked to start counting fetal movements as early as week 28 of your pregnancy. Pay attention to when your baby is most active. You may notice your baby's sleep and wake cycles. You may also notice things that make your baby move more. You should do a fetal movement count:  When your baby is normally most active.  At the same time each day. A good time to count movements is while you are resting, after having something to eat and drink. How do I count fetal movements? 1. Find a quiet, comfortable area. Sit, or lie down on your side. 2. Write down the date, the start time and stop time, and the number of movements that you felt between those two times. Take this information with you to your health care visits. 3. For 2 hours, count kicks, flutters, swishes, rolls, and jabs. You should feel at least 10 movements during 2 hours. 4.  You may stop counting after you have felt 10 movements. 5. If you do not feel 10 movements in 2 hours, have something to eat and drink. Then, keep resting and counting for 1 hour. If you feel at least 4 movements during that hour, you may stop counting. Contact a health care provider if:  You feel fewer than 4 movements in 2 hours.  Your baby is not moving like he or she usually does. Date: ____________ Start time: ____________ Stop time: ____________ Movements: ____________ Date: ____________ Start time: ____________ Stop time: ____________ Movements: ____________ Date: ____________ Start time: ____________ Stop time: ____________ Movements: ____________ Date: ____________ Start time: ____________ Stop time: ____________ Movements: ____________ Date: ____________ Start time: ____________ Stop time: ____________ Movements: ____________ Date: ____________ Start time: ____________ Stop time: ____________ Movements: ____________ Date: ____________ Start time: ____________ Stop time: ____________ Movements: ____________ Date: ____________ Start time: ____________ Stop time: ____________ Movements: ____________ Date: ____________ Start time: ____________ Stop time: ____________ Movements: ____________ This information is not intended to replace advice given to you by your health care provider. Make sure you discuss any questions you have with your health care provider. Document Released: 06/22/2006 Document Revised: 06/12/2018 Document Reviewed: 07/02/2015 Elsevier Patient Education  2020 [employer] [facility]   [facility]  [address], [place], [place] [address]  Phone: [phone]   [facility]  [address] [place], [place] [address]  Phone: [phone]   [facility] [address], [place], [place] [address] Phone: [phone]   [facility]  [address] [place], [place]  [address]  Phone: [phone]

## 2019-03-08 ENCOUNTER — Other Ambulatory Visit: Payer: Self-pay

## 2019-03-08 ENCOUNTER — Encounter: Payer: Self-pay | Admitting: Certified Nurse Midwife

## 2019-03-08 ENCOUNTER — Ambulatory Visit (INDEPENDENT_AMBULATORY_CARE_PROVIDER_SITE_OTHER): Payer: Self-pay | Admitting: Certified Nurse Midwife

## 2019-03-08 VITALS — BP 138/88 | HR 102 | Wt 149.6 lb

## 2019-03-08 DIAGNOSIS — Z131 Encounter for screening for diabetes mellitus: Secondary | ICD-10-CM

## 2019-03-08 DIAGNOSIS — Z3492 Encounter for supervision of normal pregnancy, unspecified, second trimester: Secondary | ICD-10-CM

## 2019-03-08 DIAGNOSIS — Z130 Encounter for screening for diseases of the blood and blood-forming organs and certain disorders involving the immune mechanism: Secondary | ICD-10-CM

## 2019-03-08 DIAGNOSIS — Z113 Encounter for screening for infections with a predominantly sexual mode of transmission: Secondary | ICD-10-CM

## 2019-03-08 LAB — POCT URINALYSIS DIPSTICK OB
Bilirubin, UA: NEGATIVE
Blood, UA: NEGATIVE
Glucose, UA: NEGATIVE
Ketones, UA: NEGATIVE
Leukocytes, UA: NEGATIVE
Nitrite, UA: NEGATIVE
POC,PROTEIN,UA: NEGATIVE
Spec Grav, UA: 1.015 (ref 1.010–1.025)
Urobilinogen, UA: 0.2 E.U./dL
pH, UA: 5 (ref 5.0–8.0)

## 2019-03-08 NOTE — Progress Notes (Signed)
ROB doing well. Feels good movement. Pt having round ligament pain, Reassurance given discussed self help measures. Glucose/CBC/RPR/Tdap/BTC today. Discussed birth control after baby-pamphlet given, Sample birth plan given. Follow up 2 wk.   Philip Aspen, CNM

## 2019-03-08 NOTE — Patient Instructions (Signed)
Glucose Tolerance Test During Pregnancy Why am I having this test? The glucose tolerance test (GTT) is done to check how your body processes sugar (glucose). This is one of several tests used to diagnose diabetes that develops during pregnancy (gestational diabetes mellitus). Gestational diabetes is a temporary form of diabetes that some women develop during pregnancy. It usually occurs during the second trimester of pregnancy and goes away after delivery. Testing (screening) for gestational diabetes usually occurs between 24 and 28 weeks of pregnancy. You may have the GTT test after having a 1-hour glucose screening test if the results from that test indicate that you may have gestational diabetes. You may also have this test if:  You have a history of gestational diabetes.  You have a history of giving birth to very large babies or have experienced repeated fetal loss (stillbirth).  You have signs and symptoms of diabetes, such as: ? Changes in your vision. ? Tingling or numbness in your hands or feet. ? Changes in hunger, thirst, and urination that are not otherwise explained by your pregnancy. What is being tested? This test measures the amount of glucose in your blood at different times during a period of 3 hours. This indicates how well your body is able to process glucose. What kind of sample is taken?  Blood samples are required for this test. They are usually collected by inserting a needle into a blood vessel. How do I prepare for this test?  For 3 days before your test, eat normally. Have plenty of carbohydrate-rich foods.  Follow instructions from your health care provider about: ? Eating or drinking restrictions on the day of the test. You may be asked to not eat or drink anything other than water (fast) starting 8-10 hours before the test. ? Changing or stopping your regular medicines. Some medicines may interfere with this test. Tell a health care provider about:  All  medicines you are taking, including vitamins, herbs, eye drops, creams, and over-the-counter medicines.  Any blood disorders you have.  Any surgeries you have had.  Any medical conditions you have. What happens during the test? First, your blood glucose will be measured. This is referred to as your fasting blood glucose, since you fasted before the test. Then, you will drink a glucose solution that contains a certain amount of glucose. Your blood glucose will be measured again 1, 2, and 3 hours after drinking the solution. This test takes about 3 hours to complete. You will need to stay at the testing location during this time. During the testing period:  Do not eat or drink anything other than the glucose solution.  Do not exercise.  Do not use any products that contain nicotine or tobacco, such as cigarettes and e-cigarettes. If you need help stopping, ask your health care provider. The testing procedure may vary among health care providers and hospitals. How are the results reported? Your results will be reported as milligrams of glucose per deciliter of blood (mg/dL) or millimoles per liter (mmol/L). Your health care provider will compare your results to normal ranges that were established after testing a large group of people (reference ranges). Reference ranges may vary among labs and hospitals. For this test, common reference ranges are:  Fasting: less than 95-105 mg/dL (5.3-5.8 mmol/L).  1 hour after drinking glucose: less than 180-190 mg/dL (10.0-10.5 mmol/L).  2 hours after drinking glucose: less than 155-165 mg/dL (8.6-9.2 mmol/L).  3 hours after drinking glucose: 140-145 mg/dL (7.8-8.1 mmol/L). What do the   results mean? Results within reference ranges are considered normal, meaning that your glucose levels are well-controlled. If two or more of your blood glucose levels are high, you may be diagnosed with gestational diabetes. If only one level is high, your health care  provider may suggest repeat testing or other tests to confirm a diagnosis. Talk with your health care provider about what your results mean. Questions to ask your health care provider Ask your health care provider, or the department that is doing the test:  When will my results be ready?  How will I get my results?  What are my treatment options?  What other tests do I need?  What are my next steps? Summary  The glucose tolerance test (GTT) is one of several tests used to diagnose diabetes that develops during pregnancy (gestational diabetes mellitus). Gestational diabetes is a temporary form of diabetes that some women develop during pregnancy.  You may have the GTT test after having a 1-hour glucose screening test if the results from that test indicate that you may have gestational diabetes. You may also have this test if you have any symptoms or risk factors for gestational diabetes.  Talk with your health care provider about what your results mean. This information is not intended to replace advice given to you by your health care provider. Make sure you discuss any questions you have with your health care provider. Document Released: 11/22/2011 Document Revised: 09/13/2018 Document Reviewed: 01/02/2017 Elsevier Patient Education  2020 Elsevier Inc.  

## 2019-03-09 LAB — CBC
Hematocrit: 31.7 % — ABNORMAL LOW (ref 34.0–46.6)
Hemoglobin: 10.5 g/dL — ABNORMAL LOW (ref 11.1–15.9)
MCH: 28.5 pg (ref 26.6–33.0)
MCHC: 33.1 g/dL (ref 31.5–35.7)
MCV: 86 fL (ref 79–97)
Platelets: 211 10*3/uL (ref 150–450)
RBC: 3.69 x10E6/uL — ABNORMAL LOW (ref 3.77–5.28)
RDW: 13.3 % (ref 11.7–15.4)
WBC: 11.3 10*3/uL — ABNORMAL HIGH (ref 3.4–10.8)

## 2019-03-09 LAB — GLUCOSE, 1 HOUR GESTATIONAL: Gestational Diabetes Screen: 177 mg/dL — ABNORMAL HIGH (ref 65–139)

## 2019-03-09 LAB — RPR: RPR Ser Ql: NONREACTIVE

## 2019-03-11 ENCOUNTER — Encounter: Payer: Self-pay | Admitting: Certified Nurse Midwife

## 2019-03-11 ENCOUNTER — Other Ambulatory Visit (INDEPENDENT_AMBULATORY_CARE_PROVIDER_SITE_OTHER): Payer: Self-pay | Admitting: Certified Nurse Midwife

## 2019-03-11 DIAGNOSIS — R7309 Other abnormal glucose: Secondary | ICD-10-CM

## 2019-03-11 DIAGNOSIS — O99013 Anemia complicating pregnancy, third trimester: Secondary | ICD-10-CM

## 2019-03-11 DIAGNOSIS — Z3493 Encounter for supervision of normal pregnancy, unspecified, third trimester: Secondary | ICD-10-CM

## 2019-03-11 DIAGNOSIS — O99019 Anemia complicating pregnancy, unspecified trimester: Secondary | ICD-10-CM | POA: Insufficient documentation

## 2019-03-11 DIAGNOSIS — Z131 Encounter for screening for diabetes mellitus: Secondary | ICD-10-CM

## 2019-03-12 NOTE — Progress Notes (Signed)
Left message for patient to call back and schedule appointment.-KEC

## 2019-03-15 ENCOUNTER — Other Ambulatory Visit (INDEPENDENT_AMBULATORY_CARE_PROVIDER_SITE_OTHER): Payer: Self-pay

## 2019-03-15 ENCOUNTER — Other Ambulatory Visit: Payer: Self-pay

## 2019-03-15 DIAGNOSIS — Z3493 Encounter for supervision of normal pregnancy, unspecified, third trimester: Secondary | ICD-10-CM

## 2019-03-15 DIAGNOSIS — R7309 Other abnormal glucose: Secondary | ICD-10-CM

## 2019-03-15 DIAGNOSIS — Z131 Encounter for screening for diabetes mellitus: Secondary | ICD-10-CM

## 2019-03-16 ENCOUNTER — Encounter: Payer: Self-pay | Admitting: Certified Nurse Midwife

## 2019-03-16 DIAGNOSIS — O24419 Gestational diabetes mellitus in pregnancy, unspecified control: Secondary | ICD-10-CM | POA: Insufficient documentation

## 2019-03-16 LAB — GESTATIONAL GLUCOSE TOLERANCE
Glucose, Fasting: 88 mg/dL (ref 65–94)
Glucose, GTT - 1 Hour: 147 mg/dL (ref 65–179)
Glucose, GTT - 2 Hour: 180 mg/dL — ABNORMAL HIGH (ref 65–154)
Glucose, GTT - 3 Hour: 150 mg/dL — ABNORMAL HIGH (ref 65–139)

## 2019-03-22 ENCOUNTER — Ambulatory Visit (INDEPENDENT_AMBULATORY_CARE_PROVIDER_SITE_OTHER): Payer: Self-pay | Admitting: Certified Nurse Midwife

## 2019-03-22 ENCOUNTER — Other Ambulatory Visit: Payer: Self-pay

## 2019-03-22 VITALS — BP 140/93 | HR 102 | Wt 152.8 lb

## 2019-03-22 DIAGNOSIS — O24419 Gestational diabetes mellitus in pregnancy, unspecified control: Secondary | ICD-10-CM

## 2019-03-22 DIAGNOSIS — Z3A30 30 weeks gestation of pregnancy: Secondary | ICD-10-CM

## 2019-03-22 DIAGNOSIS — Z3493 Encounter for supervision of normal pregnancy, unspecified, third trimester: Secondary | ICD-10-CM

## 2019-03-22 LAB — POCT URINALYSIS DIPSTICK OB
Bilirubin, UA: NEGATIVE
Blood, UA: NEGATIVE
Glucose, UA: NEGATIVE
Ketones, UA: NEGATIVE
Leukocytes, UA: NEGATIVE
Nitrite, UA: NEGATIVE
Spec Grav, UA: 1.02 (ref 1.010–1.025)
Urobilinogen, UA: 0.2 E.U./dL
pH, UA: 6 (ref 5.0–8.0)

## 2019-03-22 NOTE — Patient Instructions (Addendum)
Back Pain in Pregnancy Back pain during pregnancy is common. Back pain may be caused by several factors that are related to changes during your pregnancy. Follow these instructions at home: Managing pain, stiffness, and swelling      If directed, for sudden (acute) back pain, put ice on the painful area. ? Put ice in a plastic bag. ? Place a towel between your skin and the bag. ? Leave the ice on for 20 minutes, 2-3 times per day.  If directed, apply heat to the affected area before you exercise. Use the heat source that your health care provider recommends, such as a moist heat pack or a heating pad. ? Place a towel between your skin and the heat source. ? Leave the heat on for 20-30 minutes. ? Remove the heat if your skin turns bright red. This is especially important if you are unable to feel pain, heat, or cold. You may have a greater risk of getting burned.  If directed, massage the affected area. Activity  Exercise as told by your health care provider. Gentle exercise is the best way to prevent or manage back pain.  Listen to your body when lifting. If lifting hurts, ask for help or bend your knees. This uses your leg muscles instead of your back muscles.  Squat down when picking up something from the floor. Do not bend over.  Only use bed rest for short periods as told by your health care provider. Bed rest should only be used for the most severe episodes of back pain. Standing, sitting, and lying down  Do not stand in one place for long periods of time.  Use good posture when sitting. Make sure your head rests over your shoulders and is not hanging forward. Use a pillow on your lower back if necessary.  Try sleeping on your side, preferably the left side, with a pregnancy support pillow or 1-2 regular pillows between your legs. ? If you have back pain after a night's rest, your bed may be too soft. ? A firm mattress may provide more support for your back during  pregnancy. General instructions  Do not wear high heels.  Eat a healthy diet. Try to gain weight within your health care provider's recommendations.  Use a maternity girdle, elastic sling, or back brace as told by your health care provider.  Take over-the-counter and prescription medicines only as told by your health care provider.  Work with a physical therapist or massage therapist to find ways to manage back pain. Acupuncture or massage therapy may be helpful.  Keep all follow-up visits as told by your health care provider. This is important. Contact a health care provider if:  Your back pain interferes with your daily activities.  You have increasing pain in other parts of your body. Get help right away if:  You develop numbness, tingling, weakness, or problems with the use of your arms or legs.  You develop severe back pain that is not controlled with medicine.  You have a change in bowel or bladder control.  You develop shortness of breath, dizziness, or you faint.  You develop nausea, vomiting, or sweating.  You have back pain that is a rhythmic, cramping pain similar to labor pains. Labor pain is usually 1-2 minutes apart, lasts for about 1 minute, and involves a bearing down feeling or pressure in your pelvis.  You have back pain and your water breaks or you have vaginal bleeding.  You have back pain or numbness  that travels down your leg.  Your back pain developed after you fell.  You develop pain on one side of your back.  You see blood in your urine.  You develop skin blisters in the area of your back pain. Summary  Back pain may be caused by several factors that are related to changes during your pregnancy.  Follow instructions as told by your health care provider for managing pain, stiffness, and swelling.  Exercise as told by your health care provider. Gentle exercise is the best way to prevent or manage back pain.  Take over-the-counter and  prescription medicines only as told by your health care provider.  Keep all follow-up visits as told by your health care provider. This is important. This information is not intended to replace advice given to you by your health care provider. Make sure you discuss any questions you have with your health care provider. Document Released: 08/31/2005 Document Revised: 09/11/2018 Document Reviewed: 11/08/2017 Elsevier Patient Education  2020 [place]. Fetal Movement Counts Patient Name: ________________________________________________ Patient Due Date: ____________________ What is a fetal movement count?  A fetal movement count is the number of times that you feel your baby move during a certain amount of time. This may also be called a fetal kick count. A fetal movement count is recommended for every pregnant woman. You may be asked to start counting fetal movements as early as week 28 of your pregnancy. Pay attention to when your baby is most active. You may notice your baby's sleep and wake cycles. You may also notice things that make your baby move more. You should do a fetal movement count:  When your baby is normally most active.  At the same time each day. A good time to count movements is while you are resting, after having something to eat and drink. How do I count fetal movements? 1. Find a quiet, comfortable area. Sit, or lie down on your side. 2. Write down the date, the start time and stop time, and the number of movements that you felt between those two times. Take this information with you to your health care visits. 3. For 2 hours, count kicks, flutters, swishes, rolls, and jabs. You should feel at least 10 movements during 2 hours. 4. You may stop counting after you have felt 10 movements. 5. If you do not feel 10 movements in 2 hours, have something to eat and drink. Then, keep resting and counting for 1 hour. If you feel at least 4 movements during that hour, you may stop  counting. Contact a health care provider if:  You feel fewer than 4 movements in 2 hours.  Your baby is not moving like he or she usually does. Date: ____________ Start time: ____________ Stop time: ____________ Movements: ____________ Date: ____________ Start time: ____________ Stop time: ____________ Movements: ____________ Date: ____________ Start time: ____________ Stop time: ____________ Movements: ____________ Date: ____________ Start time: ____________ Stop time: ____________ Movements: ____________ Date: ____________ Start time: ____________ Stop time: ____________ Movements: ____________ Date: ____________ Start time: ____________ Stop time: ____________ Movements: ____________ Date: ____________ Start time: ____________ Stop time: ____________ Movements: ____________ Date: ____________ Start time: ____________ Stop time: ____________ Movements: ____________ Date: ____________ Start time: ____________ Stop time: ____________ Movements: ____________ This information is not intended to replace advice given to you by your health care provider. Make sure you discuss any questions you have with your health care provider. Document Released: 06/22/2006 Document Revised: 06/12/2018 Document Reviewed: 07/02/2015 Elsevier Patient Education  2020  [employer]   Gestational Diabetes Mellitus, Self Care When you have gestational diabetes (gestational diabetes mellitus), you must make sure your blood sugar (glucose) stays in a healthy range. You can do this with:  Nutrition.  Exercise.  Lifestyle changes.  Medicines or insulin, if needed.  Support from your doctors and others. If you get treated for this condition, it may not hurt you or your unborn baby (fetus). If you do not get treated for this condition, it may cause problems that can hurt you or your unborn baby. If you get gestational diabetes, you are:  More likely to get it if you get pregnant again.  More likely to develop  type 2 diabetes in the future. How to stay aware of blood sugar   Check your blood sugar every day while you are pregnant. Check it as often as told.  Call your doctor if your blood sugar is above your goal numbers for two tests in a row. Your doctor will set personal treatment goals for you. Generally, you should have these blood sugar levels:  Before meals, or after not eating for a long time (fasting or preprandial): at or below 95 mg/dL (5.3 mmol/L).  After meals (postprandial): ? One hour after a meal: at or below 140 mg/dL (7.8 mmol/L). ? Two hours after a meal: at or below 120 mg/dL (6.7 mmol/L).  A1c (hemoglobin A1c) level: 6-6.5%. How to manage high and low blood sugar Signs of high blood sugar High blood sugar is called hyperglycemia. Know the early signs of high blood sugar. Signs may include:  Feeling: ? Thirsty. ? Hungry. ? Very tired.  Needing to pee (urinate) more than usual.  Blurry vision. Signs of low blood sugar Low blood sugar is called hypoglycemia. This is when blood sugar is at or below 70 mg/dL (3.9 mmol/L). Signs may include:  Feeling: ? Hungry. ? Worried or nervous (anxious). ? Sweaty and clammy. ? Confused. ? Dizzy. ? Sleepy. ? Sick to your stomach (nauseous).  Having: ? A fast heartbeat. ? A headache. ? A change in your vision. ? Tingling or no feeling (numbness) around your mouth, lips, or tongue. ? Jerky movements that you cannot control (seizure).  Having trouble with: ? Moving (coordination). ? Sleeping. ? Passing out (fainting). ? Getting upset easily (irritability). Treating low blood sugar To treat low blood sugar, eat or drink something sugary right away. If you can think clearly and swallow safely, follow the 15:15 rule:  Take 15 grams of a fast-acting carb (carbohydrate). Talk with your doctor about how much you should take.  Some fast-acting carbs are: ? Sugar tablets (glucose pills). Take 3-4 glucose pills. ? 6-8  pieces of hard candy. ? 4-6 oz (120-150 mL) of fruit juice. ? 4-6 oz (120-150 mL) of regular (not diet) soda. ? 1 Tbsp (15 mL) honey or sugar.  Check your blood sugar 15 minutes after you take the carb.  If your blood sugar is still at or below 70 mg/dL (3.9 mmol/L), take 15 grams of a carb again.  If your blood sugar does not go above 70 mg/dL (3.9 mmol/L) after 3 tries, get help right away.  After your blood sugar goes back to normal, eat a meal or a snack within 1 hour. Treating very low blood sugar If your blood sugar is at or below 54 mg/dL (3 mmol/L), you have very low blood sugar (severe hypoglycemia). This is an emergency. Do not wait to see if the symptoms will go  away. Get medical help right away. Call your local emergency services (911 in the [place] If you have very low blood sugar and you cannot eat or drink, you may need a glucagon shot (injection). A family member or friend should learn how to check your blood sugar and how to give you a glucagon shot. Ask your doctor if you need to have a glucagon shot kit at home. Follow these instructions at home: Medicine  Take your insulin and diabetes medicines as told.  If your doctor says you should take more or less insulin or medicines, do this exactly as told.  Do not run out of insulin or medicines. Food   Make healthy food choices. These include: ? Chicken, fish, egg whites, and beans. ? Oats, whole wheat, bulgur, brown rice, quinoa, and millet. ? Fresh fruits and vegetables. ? Low-fat dairy products. ? Nuts, avocado, olive oil, and canola oil.  Meet with a food specialist (dietitian). He or she can help you make an eating plan that is right for you.  Follow instructions from your doctor about what you cannot eat or drink.  Drink enough fluid to keep your pee (urine) pale yellow.  Eat healthy snacks between healthy meals.  Keep track of carbs that you eat. Do this by reading food labels and learning food serving  sizes.  Follow your sick day plan when you cannot eat or drink normally. Make this plan with your doctor so it is ready to use. Activity  Exercise for 30 or more minutes a day, or as much as your doctor recommends.  Talk with your doctor before you start a new exercise or activity. Your doctor may need to tell you to change: ? How much insulin or medicines you take. ? How much food you eat. Lifestyle  Do not drink alcohol.  Do not use any tobacco products. These include cigarettes, chewing tobacco, and e-cigarettes. If you need help quitting, ask your doctor.  Learn how to deal with stress. If you need help with this, ask your doctor. Body care  Stay up to date with your shots (immunizations).  Brush your teeth and gums two times a day. Floss one or more times a day.  Go to the dentist one or more times every 6 months.  Stay at a healthy weight while you are pregnant. General instructions  Take over-the-counter and prescription medicines only as told by your doctor.  Ask your doctor about risks of high blood pressure in pregnancy (preeclampsia and eclampsia).  Share your diabetes care plan with: ? Your work or school. ? People you live with.  Check your pee for ketones: ? When you are sick. ? As told by your doctor.  Carry a card or wear jewelry that says you have diabetes.  Keep all follow-up visits as told by your doctor. This is important. Care after giving birth  Have your blood sugar checked 4-12 weeks after you give birth.  Get checked for diabetes one or more times during 3 years. Questions to ask your doctor  Do I need to meet with a diabetes educator?  Where can I find a support group for people with gestational diabetes? Where to find more information To learn more about diabetes, visit:  American Diabetes Association: www.diabetes.org  Centers for Disease Control and Prevention (CDC): http://www.wolf.info/ Summary  Check your blood sugar (glucose) every  day while you are pregnant. Check it as often as told.  Take your insulin and diabetes medicines as told.  Keep all follow-up visits as told by your doctor. This is important.  Have your blood sugar checked 4-12 weeks after you give birth. This information is not intended to replace advice given to you by your health care provider. Make sure you discuss any questions you have with your health care provider. Document Released: 09/14/2015 Document Revised: 11/13/2017 Document Reviewed: 06/26/2015 Elsevier Patient Education  [place].  Gestational Diabetes Mellitus, Diagnosis Gestational diabetes (gestational diabetes mellitus) is a short-term (temporary) form of diabetes that can happen during pregnancy. It goes away after you give birth. It may be caused by one or both of these problems:  Your pancreas does not make enough of a hormone called insulin.  Your body does not respond in a normal way to insulin that it makes. Insulin lets sugars (glucose) go into cells in the body. This gives you energy. If you have diabetes, sugars cannot get into cells. This causes high blood sugar (hyperglycemia). If you get gestational diabetes, you are:  More likely to get it if you get pregnant again.  More likely to develop type 2 diabetes in the future. If gestational diabetes is treated, it may not hurt you or your baby. Your doctor will set treatment goals for you. In general, you should have these blood sugar levels:  After not eating for a long time (fasting): 95 mg/dL (5.3 mmol/L).  After meals (postprandial): ? One hour after a meal: at or below 140 mg/dL (7.8 mmol/L). ? Two hours after a meal: at or below 120 mg/dL (6.7 mmol/L).  A1c (hemoglobin A1c) level: 6-6.5%. Follow these instructions at home: Questions to ask your doctor   You may want to ask these questions: ? Do I need to meet with a diabetes educator? ? What equipment will I need to care for myself at home? ? What  medicines do I need? When should I take them? ? How often do I need to check my blood sugar? ? What number can I call if I have questions? ? When is my next doctor's visit? General instructions  Take over-the-counter and prescription medicines only as told by your doctor.  Stay at a healthy weight during pregnancy.  Keep all follow-up visits as told by your doctor. This is important. Contact a doctor if:  Your blood sugar is at or above 240 mg/dL (13.3 mmol/L).  Your blood sugar is at or above 200 mg/dL (11.1 mmol/L) and you have ketones in your pee (urine).  You have been sick or have had a fever for 2 days or more and you are not getting better.  You have any of these problems for more than 6 hours: ? You cannot eat or drink. ? You feel sick to your stomach (nauseous). ? You throw up (vomit). ? You have watery poop (diarrhea). Get help right away if:  Your blood sugar is lower than 54 mg/dL (3 mmol/L).  You get confused.  You have trouble: ? Thinking clearly. ? Breathing.  Your baby moves less than normal.  You have any of these: ? Moderate or large ketone levels in your pee. ? Blood coming from your vagina. ? Unusual fluid coming from your vagina. ? Early contractions. These may feel like tightness in your belly. Summary  Gestational diabetes is a short-term form of diabetes. It can happen while you are pregnant. It goes away after you give birth.  If gestational diabetes is treated, it may not hurt you or your baby. Your doctor will set  treatment goals for you.  Keep all follow-up visits as told by your doctor. This is important. This information is not intended to replace advice given to you by your health care provider. Make sure you discuss any questions you have with your health care provider. Document Released: 09/14/2015 Document Revised: 06/29/2017 Document Reviewed: 06/26/2015 Elsevier Patient Education  2020 [employer].

## 2019-03-22 NOTE — Progress Notes (Signed)
ROB-Patient intermittent pelvic pain with physical activity x1 week.  BP recheck 138/86.

## 2019-03-22 NOTE — Progress Notes (Signed)
ROB-Reports intermittent pelvic pain, worse with movement. Discussed home treatment measures including positioning and use of abdominal support. Aware of gestational diabetes diagnosis, still needs to schedule [facility] appointment. Repeat blood pressure: 138/86. Taking iron supplement daily. Anticipatory guidance regarding course of prenatal care. Reviewed red flag symptoms and when to call. RTC x 2 weeks for ROB or sooner if needed. Agrees to schedule [facility] appt ASAP.

## 2019-03-22 NOTE — Progress Notes (Signed)
Entered. JML

## 2019-04-01 ENCOUNTER — Encounter: Payer: Self-pay | Attending: Certified Nurse Midwife | Admitting: *Deleted

## 2019-04-01 ENCOUNTER — Encounter: Payer: Self-pay | Admitting: *Deleted

## 2019-04-01 ENCOUNTER — Other Ambulatory Visit: Payer: Self-pay

## 2019-04-01 VITALS — BP 144/90 | Ht <= 58 in | Wt 152.1 lb

## 2019-04-01 DIAGNOSIS — O24419 Gestational diabetes mellitus in pregnancy, unspecified control: Secondary | ICD-10-CM | POA: Insufficient documentation

## 2019-04-01 DIAGNOSIS — Z3A00 Weeks of gestation of pregnancy not specified: Secondary | ICD-10-CM | POA: Insufficient documentation

## 2019-04-01 DIAGNOSIS — O24410 Gestational diabetes mellitus in pregnancy, diet controlled: Secondary | ICD-10-CM

## 2019-04-01 NOTE — Patient Instructions (Addendum)
Read booklet on Gestational Diabetes Follow Gestational Meal Planning Guidelines Avoid cold cereal for breakfast Avoid sugar-sweetened drinks (tea) Check blood sugars 4 x day - before breakfast and 2 hrs after every meal and record  Bring blood sugar log to all MD appointments Call MD for prescription for meter strips and lancets Strips  Accu-Chek Guide  Lancets   Accu-Chek FastClix Purchase urine ketone strips if ordered by MD and check urine ketones every am:  If + increase bedtime snack to 1 protein and 2 carbohydrate servings Walk 20-30 minutes at least 5 x week if permitted by MD

## 2019-04-01 NOTE — Progress Notes (Signed)
Diabetes Self-Management Education  Visit Type: First/Initial  Appt. Start Time: 1315 Appt. End Time: 1525  04/01/2019  [name], identified by name and date of birth, is a 33 y.o. female with a diagnosis of Diabetes: Gestational Diabetes.   ASSESSMENT  Blood pressure (!) 144/90, height 4\' 10"  (1.473 m), weight 152 lb 1.6 oz (69 kg), last menstrual period 08/14/2018, estimated date of delivery 05/26/2019 Body mass index is 31.79 kg/m.  Diabetes Self-Management Education - 04/01/19 1600      Visit Information   Visit Type  First/Initial      Initial Visit   Diabetes Type  Gestational Diabetes    Are you currently following a meal plan?  Yes    What type of meal plan do you follow?  "stopped eating a lot of sweets; smaller food portions"    Are you taking your medications as prescribed?  Yes    Date Diagnosed  2 weeks ago      Health Coping   How would you rate your overall health?  Fair      Psychosocial Assessment   Patient Belief/Attitude about Diabetes  Other (comment)   "concerned"   Self-care barriers  None    Self-management support  Doctor's office;Family    Other persons present  Spouse/SO    Patient Concerns  Nutrition/Meal planning;Glycemic Control;Weight Control;Healthy Lifestyle    Special Needs  None    Preferred Learning Style  Auditory;Visual;Hands on    Learning Readiness  Change in progress    How often do you need to have someone help you when you read instructions, pamphlets, or other written materials from your doctor or pharmacy?  1 - Never    What is the last grade level you completed in school?  high school      Pre-Education Assessment   Patient understands the diabetes disease and treatment process.  Needs Instruction    Patient understands incorporating nutritional management into lifestyle.  Needs Instruction    Patient undertands incorporating physical activity into lifestyle.  Needs Instruction    Patient understands using  medications safely.  Needs Instruction    Patient understands monitoring blood glucose, interpreting and using results  Needs Instruction    Patient understands prevention, detection, and treatment of acute complications.  Needs Instruction    Patient understands prevention, detection, and treatment of chronic complications.  Needs Instruction    Patient understands how to develop strategies to address psychosocial issues.  Needs Instruction    Patient understands how to develop strategies to promote health/change behavior.  Needs Instruction      Complications   How often do you check your blood sugar?  0 times/day (not testing)   Provided Accu-Chek Guide Me meter and instructed on use. BG upon return demonstration was 72 mg/dL at 04/03/19 pm - 2 hrs pp.   Have you had a dilated eye exam in the past 12 months?  Yes    Have you had a dental exam in the past 12 months?  Yes    Are you checking your feet?  Yes    How many days per week are you checking your feet?  7      Dietary Intake   Breakfast  oatmeal, cereal and milk (almond)    Snack (morning)  0-1 snack/day cheese or egg    Lunch  oatmeal, sandwich, leftovers from supper    Dinner  beef, chicken, pork, potaotes, rice, beans, corn, broccoli, carrots, lettuce, tomatoes, cuccumbers  Beverage(s)  water, sugar sweetened tea      Exercise   Exercise Type  ADL's      Patient Education   Previous Diabetes Education  No    Disease state   Definition of diabetes, type 1 and 2, and the diagnosis of diabetes;Factors that contribute to the development of diabetes    Nutrition management   Role of diet in the treatment of diabetes and the relationship between the three main macronutrients and blood glucose level;Food label reading, portion sizes and measuring food.;Reviewed blood glucose goals for pre and post meals and how to evaluate the patients' food intake on their blood glucose level.    Physical activity and exercise   Role of exercise on  diabetes management, blood pressure control and cardiac health.    Medications  Other (comment)   Limited use of oral medications during pregnancy and possible insulin.   Monitoring  Taught/evaluated SMBG meter.;Purpose and frequency of SMBG.;Taught/discussed recording of test results and interpretation of SMBG.;Ketone testing, when, how.    Chronic complications  Relationship between chronic complications and blood glucose control    Psychosocial adjustment  Identified and addressed patients feelings and concerns about diabetes    Preconception care  Pregnancy and GDM  Role of pre-pregnancy blood glucose control on the development of the fetus;Reviewed with patient blood glucose goals with pregnancy;Role of family planning for patients with diabetes      Individualized Goals (developed by patient)   Reducing Risk  Improve blood sugars Lose weight Lead a healthier lifestyle Become more fit     Outcomes   Expected Outcomes  Demonstrated interest in learning. Expect positive outcomes    Future DMSE  --   The patient doesn't have insurance and doesn't want to follow up with the dietitian at this time.   Program Status  Not Completed       Individualized Plan for Diabetes Self-Management Training:   Learning Objective:  Patient will have a greater understanding of diabetes self-management. Patient education plan is to attend individual and/or group sessions per assessed needs and concerns.   Plan:   Patient Instructions  Read booklet on Gestational Diabetes Follow Gestational Meal Planning Guidelines Avoid cold cereal for breakfast Avoid sugar-sweetened drinks (tea) Check blood sugars 4 x day - before breakfast and 2 hrs after every meal and record  Bring blood sugar log to all MD appointments Call MD for prescription for meter strips and lancets Strips  Accu-Chek Guide  Lancets   Accu-Chek FastClix Purchase urine ketone strips if ordered by MD and check urine ketones every am:  If  + increase bedtime snack to 1 protein and 2 carbohydrate servings Walk 20-30 minutes at least 5 x week if permitted by MD  Expected Outcomes:  Demonstrated interest in learning. Expect positive outcomes  Education material provided:  Gestational Booklet Gestational Meal Planning Guidelines Simple Meal Plan Viewed Gestational Diabetes Video Meter =Accu-Chek Guide Me 3 Day Food Record Goals for a Healthy Pregnancy  If problems or questions, patient to contact team via:  Johny Drilling, RN, [place], CDE [phone]  Future DSME appointment:  The patient doesn't have insurance and doesn't want to follow up with the dietitian at this time.

## 2019-04-03 ENCOUNTER — Other Ambulatory Visit: Payer: Self-pay

## 2019-04-03 ENCOUNTER — Encounter: Payer: Self-pay | Admitting: Certified Nurse Midwife

## 2019-04-03 ENCOUNTER — Ambulatory Visit (INDEPENDENT_AMBULATORY_CARE_PROVIDER_SITE_OTHER): Payer: Self-pay | Admitting: Certified Nurse Midwife

## 2019-04-03 VITALS — BP 141/89 | HR 91 | Wt 151.2 lb

## 2019-04-03 DIAGNOSIS — Z3493 Encounter for supervision of normal pregnancy, unspecified, third trimester: Secondary | ICD-10-CM

## 2019-04-03 LAB — POCT URINALYSIS DIPSTICK OB
Bilirubin, UA: NEGATIVE
Glucose, UA: NEGATIVE
Leukocytes, UA: NEGATIVE
Nitrite, UA: NEGATIVE
POC,PROTEIN,UA: NEGATIVE
Spec Grav, UA: 1.025 (ref 1.010–1.025)
Urobilinogen, UA: 0.2 E.U./dL
pH, UA: 5 (ref 5.0–8.0)

## 2019-04-03 NOTE — Progress Notes (Signed)
ROB doing well , feels good movement. Had lifestyles appointment. BS log reviewed. Fasting's 80-90, 2 hr pp 105-84.(scanned to chart).  BP's 140/80-80, baseline pre -e labs today. Reviewed signs and symptoms with pt. She denies. Discussed u/s & NST @ 36 wks for GDM. She verbalizes and agrees . Will follow up with results. Return in 2 wks. Or sooner as needed.  Philip Aspen, CNM

## 2019-04-03 NOTE — Patient Instructions (Signed)
Braxton Hicks Contractions Contractions of the uterus can occur throughout pregnancy, but they are not always a sign that you are in labor. You may have practice contractions called Braxton Hicks contractions. These false labor contractions are sometimes confused with true labor. What are Braxton Hicks contractions? Braxton Hicks contractions are tightening movements that occur in the muscles of the uterus before labor. Unlike true labor contractions, these contractions do not result in opening (dilation) and thinning of the cervix. Toward the end of pregnancy (32-34 weeks), Braxton Hicks contractions can happen more often and may become stronger. These contractions are sometimes difficult to tell apart from true labor because they can be very uncomfortable. You should not feel embarrassed if you go to the hospital with false labor. Sometimes, the only way to tell if you are in true labor is for your health care provider to look for changes in the cervix. The health care provider will do a physical exam and may monitor your contractions. If you are not in true labor, the exam should show that your cervix is not dilating and your water has not broken. If there are no other health problems associated with your pregnancy, it is completely safe for you to be sent home with false labor. You may continue to have Braxton Hicks contractions until you go into true labor. How to tell the difference between true labor and false labor True labor  Contractions last 30-70 seconds.  Contractions become very regular.  Discomfort is usually felt in the top of the uterus, and it spreads to the lower abdomen and low back.  Contractions do not go away with walking.  Contractions usually become more intense and increase in frequency.  The cervix dilates and gets thinner. False labor  Contractions are usually shorter and not as strong as true labor contractions.  Contractions are usually irregular.  Contractions  are often felt in the front of the lower abdomen and in the groin.  Contractions may go away when you walk around or change positions while lying down.  Contractions get weaker and are shorter-lasting as time goes on.  The cervix usually does not dilate or become thin. Follow these instructions at home:   Take over-the-counter and prescription medicines only as told by your health care provider.  Keep up with your usual exercises and follow other instructions from your health care provider.  Eat and drink lightly if you think you are going into labor.  If Braxton Hicks contractions are making you uncomfortable: ? Change your position from lying down or resting to walking, or change from walking to resting. ? Sit and rest in a tub of warm water. ? Drink enough fluid to keep your urine pale yellow. Dehydration may cause these contractions. ? Do slow and deep breathing several times an hour.  Keep all follow-up prenatal visits as told by your health care provider. This is important. Contact a health care provider if:  You have a fever.  You have continuous pain in your abdomen. Get help right away if:  Your contractions become stronger, more regular, and closer together.  You have fluid leaking or gushing from your vagina.  You pass blood-tinged mucus (bloody show).  You have bleeding from your vagina.  You have low back pain that you never had before.  You feel your baby's head pushing down and causing pelvic pressure.  Your baby is not moving inside you as much as it used to. Summary  Contractions that occur before labor are   called Braxton Hicks contractions, false labor, or practice contractions.  Braxton Hicks contractions are usually shorter, weaker, farther apart, and less regular than true labor contractions. True labor contractions usually become progressively stronger and regular, and they become more frequent.  Manage discomfort from Braxton Hicks contractions  by changing position, resting in a warm bath, drinking plenty of water, or practicing deep breathing. This information is not intended to replace advice given to you by your health care provider. Make sure you discuss any questions you have with your health care provider. Document Released: 10/06/2016 Document Revised: 05/05/2017 Document Reviewed: 10/06/2016 [employer] Patient Education  2020 [employer]  

## 2019-04-04 LAB — CBC WITH DIFFERENTIAL/PLATELET
Basophils Absolute: 0 10*3/uL (ref 0.0–0.2)
Basos: 0 %
EOS (ABSOLUTE): 0.1 10*3/uL (ref 0.0–0.4)
Eos: 1 %
Hematocrit: 35.7 % (ref 34.0–46.6)
Hemoglobin: 11.8 g/dL (ref 11.1–15.9)
Immature Grans (Abs): 0.1 10*3/uL (ref 0.0–0.1)
Immature Granulocytes: 1 %
Lymphocytes Absolute: 1.4 10*3/uL (ref 0.7–3.1)
Lymphs: 16 %
MCH: 28.2 pg (ref 26.6–33.0)
MCHC: 33.1 g/dL (ref 31.5–35.7)
MCV: 85 fL (ref 79–97)
Monocytes Absolute: 0.5 10*3/uL (ref 0.1–0.9)
Monocytes: 5 %
Neutrophils Absolute: 6.9 10*3/uL (ref 1.4–7.0)
Neutrophils: 77 %
Platelets: 168 10*3/uL (ref 150–450)
RBC: 4.19 x10E6/uL (ref 3.77–5.28)
RDW: 15.8 % — ABNORMAL HIGH (ref 11.7–15.4)
WBC: 8.9 10*3/uL (ref 3.4–10.8)

## 2019-04-04 LAB — COMPREHENSIVE METABOLIC PANEL
ALT: 7 IU/L (ref 0–32)
AST: 9 IU/L (ref 0–40)
Albumin/Globulin Ratio: 1.5 (ref 1.2–2.2)
Albumin: 3.7 g/dL — ABNORMAL LOW (ref 3.8–4.8)
Alkaline Phosphatase: 93 IU/L (ref 39–117)
BUN/Creatinine Ratio: 16 (ref 9–23)
BUN: 7 mg/dL (ref 6–20)
Bilirubin Total: 0.3 mg/dL (ref 0.0–1.2)
CO2: 20 mmol/L (ref 20–29)
Calcium: 9.2 mg/dL (ref 8.7–10.2)
Chloride: 102 mmol/L (ref 96–106)
Creatinine, Ser: 0.45 mg/dL — ABNORMAL LOW (ref 0.57–1.00)
GFR calc Af Amer: 154 mL/min/{1.73_m2} (ref >59)
GFR calc non Af Amer: 134 mL/min/{1.73_m2} (ref >59)
Globulin, Total: 2.4 g/dL (ref 1.5–4.5)
Glucose: 98 mg/dL (ref 65–99)
Potassium: 3.7 mmol/L (ref 3.5–5.2)
Sodium: 136 mmol/L (ref 134–144)
Total Protein: 6.1 g/dL (ref 6.0–8.5)

## 2019-04-04 LAB — PROTEIN / CREATININE RATIO, URINE
Creatinine, Urine: 142.7 mg/dL
Protein, Ur: 16.4 mg/dL
Protein/Creat Ratio: 115 mg/g creat (ref 0–200)

## 2019-04-18 ENCOUNTER — Other Ambulatory Visit: Payer: Self-pay

## 2019-04-18 ENCOUNTER — Ambulatory Visit (INDEPENDENT_AMBULATORY_CARE_PROVIDER_SITE_OTHER): Payer: Self-pay | Admitting: Certified Nurse Midwife

## 2019-04-18 VITALS — BP 122/93 | HR 98 | Wt 151.4 lb

## 2019-04-18 DIAGNOSIS — Z3493 Encounter for supervision of normal pregnancy, unspecified, third trimester: Secondary | ICD-10-CM

## 2019-04-18 DIAGNOSIS — Z3A35 35 weeks gestation of pregnancy: Secondary | ICD-10-CM

## 2019-04-18 LAB — POCT URINALYSIS DIPSTICK OB
Bilirubin, UA: NEGATIVE
Blood, UA: NEGATIVE
Glucose, UA: NEGATIVE
Ketones, UA: NEGATIVE
Leukocytes, UA: NEGATIVE
Nitrite, UA: NEGATIVE
POC,PROTEIN,UA: NEGATIVE
Spec Grav, UA: <=1.005 — AB (ref 1.010–1.025)
Urobilinogen, UA: 0.2 E.U./dL
pH, UA: 6 (ref 5.0–8.0)

## 2019-04-18 NOTE — Patient Instructions (Signed)
Group B Streptococcus Test During Pregnancy Why am I having this test? Routine testing, also called screening, for group B streptococcus (GBS) is recommended for all pregnant women between the 36th and 37th week of pregnancy. GBS is a type of bacteria that can be passed from mother to baby during childbirth. Screening will help guide whether or not you will need treatment during labor and delivery to prevent complications such as:  An infection in your uterus during labor.  An infection in your uterus after delivery.  A serious infection in your baby after delivery, such as pneumonia, meningitis, or sepsis. GBS screening is not often done before 36 weeks of pregnancy unless you go into labor prematurely. What happens if I have group B streptococcus? If testing shows that you have GBS, your health care provider will recommend treatment with IV antibiotics during labor and delivery. This treatment significantly decreases the risk of complications for you and your baby. If you have a planned C-section and you have GBS, you may not need to be treated with antibiotics because GBS is usually passed to babies after labor starts and your water breaks. If you are in labor or your water breaks before your C-section, it is possible for GBS to get into your uterus and be passed to your baby, so you might need treatment. Is there a chance I may not need to be tested? You may not need to be tested for GBS if:  You have a urine test that shows GBS before 36 to 37 weeks.  You had a baby with GBS infection after a previous delivery. In these cases, you will automatically be treated for GBS during labor and delivery. What is being tested? This test is done to check if you have group B streptococcus in your vagina or rectum. What kind of sample is taken? To collect samples for this test, your health care provider will swab your vagina and rectum with a cotton swab. The sample is then sent to the lab to see if  GBS is present. What happens during the test?   You will remove your clothing from the waist down.  You will lie down on an exam table in the same position as you would for a pelvic exam.  Your health care provider will swab your vagina and rectum to collect samples for a culture test.  You will be able to go home after the test and do all your usual activities. How are the results reported? The test results are reported as positive or negative. What do the results mean?  A positive test means you are at risk for passing GBS to your baby during labor and delivery. Your health care provider will recommend that you are treated with an IV antibiotic during labor and delivery.  A negative test means you are at very low risk of passing GBS to your baby. There is still a low risk of passing GBS to your baby because sometimes test results may report that you do not have a condition when you do (false-negative result) or there is a chance that you may become infected with GBS after the test is done. You most likely will not need to be treated with an antibiotic during labor and delivery. Talk with your health care provider about what your results mean. Questions to ask your health care provider Ask your health care provider, or the department that is doing the test:  When will my results be ready?  How will I   get my results?  What are my treatment options? Summary  Routine testing (screening) for group B streptococcus (GBS) is recommended for all pregnant women between the 36th and 37th week of pregnancy.  GBS is a type of bacteria that can be passed from mother to baby during childbirth.  If testing shows that you have GBS, your health care provider will recommend that you are treated with IV antibiotics during labor and delivery. This treatment almost always prevents infection in newborns. This information is not intended to replace advice given to you by your health care provider. Make  sure you discuss any questions you have with your health care provider. Document Released: 06/20/2018 Document Revised: 09/13/2018 Document Reviewed: 06/20/2018 Elsevier Patient Education  2020 [employer] WHAT OB PATIENTS CAN EXPECT   Confirmation of pregnancy and ultrasound ordered if medically indicated-[redacted] weeks gestation  New OB (NOB) intake with nurse and New OB (NOB) labs- [redacted] weeks gestation  New OB (NOB) physical examination with provider- 11/[redacted] weeks gestation  Flu vaccine-[redacted] weeks gestation  Anatomy scan-[redacted] weeks gestation  Glucose tolerance test, blood work to test for anemia, T-dap vaccine-[redacted] weeks gestation  Vaginal swabs/cultures-STD/Group B strep-[redacted] weeks gestation  Appointments every 4 weeks until 28 weeks  Every 2 weeks from 28 weeks until 36 weeks  Weekly visits from 36 weeks until delivery  Fetal Movement Counts Patient Name: ________________________________________________ Patient Due Date: ____________________ What is a fetal movement count?  A fetal movement count is the number of times that you feel your baby move during a certain amount of time. This may also be called a fetal kick count. A fetal movement count is recommended for every pregnant woman. You may be asked to start counting fetal movements as early as week 28 of your pregnancy. Pay attention to when your baby is most active. You may notice your baby's sleep and wake cycles. You may also notice things that make your baby move more. You should do a fetal movement count:  When your baby is normally most active.  At the same time each day. A good time to count movements is while you are resting, after having something to eat and drink. How do I count fetal movements? 1. Find a quiet, comfortable area. Sit, or lie down on your side. 2. Write down the date, the start time and stop time, and the number of movements that you felt between those two times. Take this information with you to your health  care visits. 3. For 2 hours, count kicks, flutters, swishes, rolls, and jabs. You should feel at least 10 movements during 2 hours. 4. You may stop counting after you have felt 10 movements. 5. If you do not feel 10 movements in 2 hours, have something to eat and drink. Then, keep resting and counting for 1 hour. If you feel at least 4 movements during that hour, you may stop counting. Contact a health care provider if:  You feel fewer than 4 movements in 2 hours.  Your baby is not moving like he or she usually does. Date: ____________ Start time: ____________ Stop time: ____________ Movements: ____________ Date: ____________ Start time: ____________ Stop time: ____________ Movements: ____________ Date: ____________ Start time: ____________ Stop time: ____________ Movements: ____________ Date: ____________ Start time: ____________ Stop time: ____________ Movements: ____________ Date: ____________ Start time: ____________ Stop time: ____________ Movements: ____________ Date: ____________ Start time: ____________ Stop time: ____________ Movements: ____________ Date: ____________ Start time: ____________ Stop time: ____________ Movements: ____________ Date: ____________ Start time:  ____________ Stop time: ____________ Movements: ____________ Date: ____________ Start time: ____________ Stop time: ____________ Movements: ____________ This information is not intended to replace advice given to you by your health care provider. Make sure you discuss any questions you have with your health care provider. Document Released: 06/22/2006 Document Revised: 06/12/2018 Document Reviewed: 07/02/2015 Elsevier Patient Education  2020 [employer] Hypertension During Pregnancy Hypertension is also called high blood pressure. High blood pressure means that the force of your blood moving in your body is too strong. It can cause problems for you and your baby. Different types of high blood pressure can happen  during pregnancy. The types are:  High blood pressure before you got pregnant. This is called chronic hypertension.  This can continue during your pregnancy. Your doctor will want to keep checking your blood pressure. You may need medicine to keep your blood pressure under control while you are pregnant. You will need follow-up visits after you have your baby.  High blood pressure that goes up during pregnancy when it was normal before. This is called gestational hypertension. It will usually get better after you have your baby, but your doctor will need to watch your blood pressure to make sure that it is getting better.  Very high blood pressure during pregnancy. This is called preeclampsia. Very high blood pressure is an emergency that needs to be checked and treated right away.  You may develop very high blood pressure after giving birth. This is called postpartum preeclampsia. This usually occurs within 48 hours after childbirth but may occur up to 6 weeks after giving birth. This is rare. How does this affect me? If you have high blood pressure during pregnancy, you have a higher chance of developing high blood pressure:  As you get older.  If you get pregnant again. In some cases, high blood pressure during pregnancy can cause:  Stroke.  Heart attack.  Damage to the kidneys, lungs, or liver.  Preeclampsia.  Jerky movements you cannot control (convulsions or seizures).  Problems with the placenta. How does this affect my baby? Your baby may:  Be born early.  Not weigh as much as he or she should.  Not handle labor well, leading to a c-section birth. What are the risks?  Having high blood pressure during a past pregnancy.  Being overweight.  Being 527 years old or older.  Being pregnant for the first time.  Being pregnant with more than one baby.  Becoming pregnant using fertility methods, such as IVF.  Having other problems, such as diabetes, or kidney disease.   Having family members who have high blood pressure. What can I do to lower my risk?   Keep a healthy weight.  Eat a healthy diet.  Follow what your doctor tells you about treating any medical problems that you had before becoming pregnant. It is very important to go to all of your doctor visits. Your doctor will check your blood pressure and make sure that your pregnancy is progressing as it should. Treatment should start early if a problem is found. How is this treated? Treatment for high blood pressure during pregnancy can differ depending on the type of high blood pressure you have and how serious it is.  You may need to take blood pressure medicine.  If you have been taking medicine for your blood pressure, you may need to change the medicine during pregnancy if it is not safe for your baby.  If your doctor thinks that you could get  very high blood pressure, he or she may tell you to take a low-dose aspirin during your pregnancy.  If you have very high blood pressure, you may need to stay in the hospital so you and your baby can be watched closely. You may also need to take medicine to lower your blood pressure. This medicine may be given by mouth or through an IV tube.  In some cases, if your condition gets worse, you may need to have your baby early. Follow these instructions at home: Eating and drinking   Drink enough fluid to keep your pee (urine) pale yellow.  Avoid caffeine. Lifestyle  Do not use any products that contain nicotine or tobacco, such as cigarettes, e-cigarettes, and chewing tobacco. If you need help quitting, ask your doctor.  Do not use alcohol or drugs.  Avoid stress.  Rest and get plenty of sleep.  Regular exercise can help. Ask your doctor what kinds of exercise are best for you. General instructions  Take over-the-counter and prescription medicines only as told by your doctor.  Keep all prenatal and follow-up visits as told by your doctor.  This is important. Contact a doctor if:  You have symptoms that your doctor told you to watch for, such as: ? Headaches. ? Nausea. ? Vomiting. ? Belly (abdominal) pain. ? Dizziness. ? Light-headedness. Get help right away if:  You have: ? Very bad belly pain that does not get better with treatment. ? A very bad headache that does not get better. ? Vomiting that does not get better. ? Sudden, fast weight gain. ? Sudden swelling in your hands, ankles, or face. ? Bleeding from your vagina. ? Blood in your pee. ? Blurry vision. ? Double vision. ? Shortness of breath. ? Chest pain. ? Weakness on one side of your body. ? Trouble talking.  Your baby is not moving as much as usual. Summary  High blood pressure is also called hypertension.  High blood pressure means that the force of your blood moving in your body is too strong.  High blood pressure can cause problems for you and your baby.  Keep all follow-up visits as told by your doctor. This is important. This information is not intended to replace advice given to you by your health care provider. Make sure you discuss any questions you have with your health care provider. Document Released: 06/25/2010 Document Revised: 09/13/2018 Document Reviewed: 06/19/2018 Elsevier Patient Education  2020 [employer].

## 2019-04-18 NOTE — Progress Notes (Signed)
ROB-No complaints. BP recheck 126/92.

## 2019-04-18 NOTE — Progress Notes (Signed)
ROB-Doing well, no signs of symptoms of pre-eclampsia. Blood sugar log reviewed, all values within normal limits. May continue with fasting values only. Anticipatory guidance regarding course of prenatal care. Reviewed red flag symptoms and when to call. RTC x 2-3 weeks for cultures and ROB or sooner if needed.

## 2019-05-06 ENCOUNTER — Other Ambulatory Visit: Payer: Self-pay

## 2019-05-06 ENCOUNTER — Ambulatory Visit (INDEPENDENT_AMBULATORY_CARE_PROVIDER_SITE_OTHER): Payer: Self-pay | Admitting: Certified Nurse Midwife

## 2019-05-06 VITALS — BP 141/91 | HR 92 | Wt 153.4 lb

## 2019-05-06 DIAGNOSIS — Z3493 Encounter for supervision of normal pregnancy, unspecified, third trimester: Secondary | ICD-10-CM

## 2019-05-06 DIAGNOSIS — O24410 Gestational diabetes mellitus in pregnancy, diet controlled: Secondary | ICD-10-CM

## 2019-05-06 LAB — POCT URINALYSIS DIPSTICK OB
Bilirubin, UA: NEGATIVE
Blood, UA: NEGATIVE
Glucose, UA: NEGATIVE
Ketones, UA: NEGATIVE
Leukocytes, UA: NEGATIVE
Nitrite, UA: NEGATIVE
POC,PROTEIN,UA: NEGATIVE
Spec Grav, UA: 1.015 (ref 1.010–1.025)
Urobilinogen, UA: 0.2 E.U./dL
pH, UA: 5 (ref 5.0–8.0)

## 2019-05-06 NOTE — Progress Notes (Signed)
ROB doing well. GBS and cultures today. Discussed NST and u/s this wk for GDM. NST and ROB next week. Reviewed induction by 40 wks. She verbalizes and agrees to plan . SVE 3/60/-3. BS log reviewed , checkingfastings only per Sharyn Lull. Fasting's in normal range. Copy made to scan into chart. Follow up as noted above

## 2019-05-06 NOTE — Patient Instructions (Signed)
Gestational Diabetes Mellitus, Diagnosis Gestational diabetes (gestational diabetes mellitus) is a short-term (temporary) form of diabetes that can happen during pregnancy. It goes away after you give birth. It may be caused by one or both of these problems:  Your pancreas does not make enough of a hormone called insulin.  Your body does not respond in a normal way to insulin that it makes. Insulin lets sugars (glucose) go into cells in the body. This gives you energy. If you have diabetes, sugars cannot get into cells. This causes high blood sugar (hyperglycemia). If you get gestational diabetes, you are:  More likely to get it if you get pregnant again.  More likely to develop type 2 diabetes in the future. If gestational diabetes is treated, it may not hurt you or your baby. Your doctor will set treatment goals for you. In general, you should have these blood sugar levels:  After not eating for a long time (fasting): 95 mg/dL (5.3 mmol/L).  After meals (postprandial): ? One hour after a meal: at or below 140 mg/dL (7.8 mmol/L). ? Two hours after a meal: at or below 120 mg/dL (6.7 mmol/L).  A1c (hemoglobin A1c) level: 6-6.5%. Follow these instructions at home: Questions to ask your doctor   You may want to ask these questions: ? Do I need to meet with a diabetes educator? ? What equipment will I need to care for myself at home? ? What medicines do I need? When should I take them? ? How often do I need to check my blood sugar? ? What number can I call if I have questions? ? When is my next doctor's visit? General instructions  Take over-the-counter and prescription medicines only as told by your doctor.  Stay at a healthy weight during pregnancy.  Keep all follow-up visits as told by your doctor. This is important. Contact a doctor if:  Your blood sugar is at or above 240 mg/dL (13.3 mmol/L).  Your blood sugar is at or above 200 mg/dL (11.1 mmol/L) and you have ketones in  your pee (urine).  You have been sick or have had a fever for 2 days or more and you are not getting better.  You have any of these problems for more than 6 hours: ? You cannot eat or drink. ? You feel sick to your stomach (nauseous). ? You throw up (vomit). ? You have watery poop (diarrhea). Get help right away if:  Your blood sugar is lower than 54 mg/dL (3 mmol/L).  You get confused.  You have trouble: ? Thinking clearly. ? Breathing.  Your baby moves less than normal.  You have any of these: ? Moderate or large ketone levels in your pee. ? Blood coming from your vagina. ? Unusual fluid coming from your vagina. ? Early contractions. These may feel like tightness in your belly. Summary  Gestational diabetes is a short-term form of diabetes. It can happen while you are pregnant. It goes away after you give birth.  If gestational diabetes is treated, it may not hurt you or your baby. Your doctor will set treatment goals for you.  Keep all follow-up visits as told by your doctor. This is important. This information is not intended to replace advice given to you by your health care provider. Make sure you discuss any questions you have with your health care provider. Document Released: 09/14/2015 Document Revised: 06/29/2017 Document Reviewed: 06/26/2015 Elsevier Patient Education  2020 Elsevier Inc.  

## 2019-05-06 NOTE — Addendum Note (Signed)
Addended by: Raliegh Ip on: 05/06/2019 02:16 PM   Modules accepted: Orders

## 2019-05-07 ENCOUNTER — Ambulatory Visit: Payer: Self-pay | Admitting: Certified Nurse Midwife

## 2019-05-07 ENCOUNTER — Ambulatory Visit (INDEPENDENT_AMBULATORY_CARE_PROVIDER_SITE_OTHER): Payer: Self-pay | Admitting: Certified Nurse Midwife

## 2019-05-07 ENCOUNTER — Ambulatory Visit (INDEPENDENT_AMBULATORY_CARE_PROVIDER_SITE_OTHER): Payer: Self-pay

## 2019-05-07 ENCOUNTER — Other Ambulatory Visit: Payer: Self-pay | Admitting: Certified Nurse Midwife

## 2019-05-07 VITALS — BP 137/96 | HR 109 | Wt 151.4 lb

## 2019-05-07 DIAGNOSIS — O24410 Gestational diabetes mellitus in pregnancy, diet controlled: Secondary | ICD-10-CM

## 2019-05-07 DIAGNOSIS — O365920 Maternal care for other known or suspected poor fetal growth, second trimester, not applicable or unspecified: Secondary | ICD-10-CM

## 2019-05-07 DIAGNOSIS — Z3493 Encounter for supervision of normal pregnancy, unspecified, third trimester: Secondary | ICD-10-CM

## 2019-05-07 DIAGNOSIS — Z3A34 34 weeks gestation of pregnancy: Secondary | ICD-10-CM

## 2019-05-07 DIAGNOSIS — R7309 Other abnormal glucose: Secondary | ICD-10-CM

## 2019-05-07 DIAGNOSIS — O365930 Maternal care for other known or suspected poor fetal growth, third trimester, not applicable or unspecified: Secondary | ICD-10-CM

## 2019-05-07 DIAGNOSIS — Z3483 Encounter for supervision of other normal pregnancy, third trimester: Secondary | ICD-10-CM

## 2019-05-07 DIAGNOSIS — Z3A37 37 weeks gestation of pregnancy: Secondary | ICD-10-CM

## 2019-05-07 LAB — POCT URINALYSIS DIPSTICK OB
Bilirubin, UA: NEGATIVE
Glucose, UA: NEGATIVE
Nitrite, UA: NEGATIVE
Spec Grav, UA: 1.02 (ref 1.010–1.025)
Urobilinogen, UA: 0.2 E.U./dL
pH, UA: 6 (ref 5.0–8.0)

## 2019-05-07 NOTE — Progress Notes (Signed)
NST and u/s for GDM diet controlled.   NST reactive Baseline 150 Accelrations present Moderate variability Decelerations absent.  Toco:irritability noted.   Patient Name: Brenda Rogers DOB: [birth year] MRN: [id number]   ULTRASOUND REPORT  Location: [facility] Date of Service: 05/07/2019   Indications:growth/afi Findings:  Brenda Rogers intrauterine pregnancy is visualized with FHR at 162 BPM. Biometrics give an (U/S) Gestational age of [redacted]w[redacted]d and an (U/S) EDD of 06/14/2019; this correlates does not with the clinically established Estimated Date of Delivery: 05/26/19.  Fetal presentation is Cephalic.  Placenta: anterior. Grade: 1 AFI: 19.2 cm  Growth percentile is 7%. EFW: 2460 g ( 5lbs 7 oz)   Umbilical Artery Dopplers were performed due to 7 %, fetal growth restriction and low amniotic fluid index  Systolic and Diastolic blood flow in each umbilical artery appear normal and without reversal or absence of diastolic flow.   The maximum S/D ratio is 2.23.   According to [email or web address], this ratio is normal for this gestational age.  Impression: 1. [redacted]w[redacted]d Viable Singleton Intrauterine pregnancy previously established criteria. 2. Growth is 7 %ile.  AFI is 19.2 cm.   Recommendations: 1.Clinical correlation with the patient's History and Physical Exam.   Jenine  M. [facility]     RDMS   Consulted Dr. Marcelline Mates . Plan of care reviewed. Stat labs today for PIH. Will follow up with pt. IF labs abnormal will notify pt tonight and instruct to go to L&D. If normal pt return 1 wk for NST, u/s growth &doppler studies.   Philip Aspen, CNM

## 2019-05-07 NOTE — Progress Notes (Signed)
NST-No complaints.    Brenda Rogers, [name]

## 2019-05-08 ENCOUNTER — Telehealth: Payer: Self-pay | Admitting: Certified Nurse Midwife

## 2019-05-08 LAB — COMPREHENSIVE METABOLIC PANEL
ALT: 8 IU/L (ref 0–32)
AST: 14 IU/L (ref 0–40)
Albumin/Globulin Ratio: 1.5 (ref 1.2–2.2)
Albumin: 3.8 g/dL (ref 3.8–4.8)
Alkaline Phosphatase: 123 IU/L — ABNORMAL HIGH (ref 39–117)
BUN/Creatinine Ratio: 18 (ref 9–23)
BUN: 9 mg/dL (ref 6–20)
Bilirubin Total: 0.3 mg/dL (ref 0.0–1.2)
CO2: 21 mmol/L (ref 20–29)
Calcium: 9.4 mg/dL (ref 8.7–10.2)
Chloride: 104 mmol/L (ref 96–106)
Creatinine, Ser: 0.51 mg/dL — ABNORMAL LOW (ref 0.57–1.00)
GFR calc Af Amer: 148 mL/min/{1.73_m2} (ref >59)
GFR calc non Af Amer: 129 mL/min/{1.73_m2} (ref >59)
Globulin, Total: 2.6 g/dL (ref 1.5–4.5)
Glucose: 76 mg/dL (ref 65–99)
Potassium: 3.7 mmol/L (ref 3.5–5.2)
Sodium: 138 mmol/L (ref 134–144)
Total Protein: 6.4 g/dL (ref 6.0–8.5)

## 2019-05-08 LAB — CBC WITH DIFFERENTIAL/PLATELET
Basophils Absolute: 0 10*3/uL (ref 0.0–0.2)
Basos: 0 %
EOS (ABSOLUTE): 0 10*3/uL (ref 0.0–0.4)
Eos: 0 %
Hematocrit: 39 % (ref 34.0–46.6)
Hemoglobin: 12.9 g/dL (ref 11.1–15.9)
Immature Grans (Abs): 0.1 10*3/uL (ref 0.0–0.1)
Immature Granulocytes: 1 %
Lymphocytes Absolute: 1.3 10*3/uL (ref 0.7–3.1)
Lymphs: 13 %
MCH: 29.7 pg (ref 26.6–33.0)
MCHC: 33.1 g/dL (ref 31.5–35.7)
MCV: 90 fL (ref 79–97)
Monocytes Absolute: 0.7 10*3/uL (ref 0.1–0.9)
Monocytes: 7 %
Neutrophils Absolute: 7.9 10*3/uL — ABNORMAL HIGH (ref 1.4–7.0)
Neutrophils: 79 %
Platelets: 154 10*3/uL (ref 150–450)
RBC: 4.35 x10E6/uL (ref 3.77–5.28)
RDW: 16.5 % — ABNORMAL HIGH (ref 11.7–15.4)
WBC: 9.9 10*3/uL (ref 3.4–10.8)

## 2019-05-08 LAB — GC/CHLAMYDIA PROBE AMP
Chlamydia trachomatis, NAA: NEGATIVE
Neisseria Gonorrhoeae by PCR: NEGATIVE

## 2019-05-08 LAB — PROTEIN / CREATININE RATIO, URINE
Creatinine, Urine: 106.2 mg/dL
Protein, Ur: 24.4 mg/dL
Protein/Creat Ratio: 230 mg/g creat — ABNORMAL HIGH (ref 0–200)

## 2019-05-08 LAB — STREP GP B NAA: Strep Gp B NAA: NEGATIVE

## 2019-05-08 NOTE — Patient Instructions (Signed)
Preeclampsia and Eclampsia °Preeclampsia is a serious condition that may develop during pregnancy. This condition causes high blood pressure and increased protein in your urine along with other symptoms, such as headaches and vision changes. These symptoms may develop as the condition gets worse. Preeclampsia may occur at 20 weeks of pregnancy or later. °Diagnosing and treating preeclampsia early is very important. If not treated early, it can cause serious problems for you and your baby. One problem it can lead to is eclampsia. Eclampsia is a condition that causes muscle jerking or shaking (convulsions or seizures) and other serious problems for the mother. During pregnancy, delivering your baby may be the best treatment for preeclampsia or eclampsia. For most women, preeclampsia and eclampsia symptoms go away after giving birth. °In rare cases, a woman may develop preeclampsia after giving birth (postpartum preeclampsia). This usually occurs within 48 hours after childbirth but may occur up to 6 weeks after giving birth. °What are the causes? °The cause of preeclampsia is not known. °What increases the risk? °The following risk factors make you more likely to develop preeclampsia: °· Being pregnant for the first time. °· Having had preeclampsia during a past pregnancy. °· Having a family history of preeclampsia. °· Having high blood pressure. °· Being pregnant with more than one baby. °· Being 35 or older. °· Being African-American. °· Having kidney disease or diabetes. °· Having medical conditions such as lupus or blood diseases. °· Being very overweight (obese). °What are the signs or symptoms? °The most common symptoms are: °· Severe headaches. °· Vision problems, such as blurred or double vision. °· Abdominal pain, especially upper abdominal pain. °Other symptoms that may develop as the condition gets worse include: °· Sudden weight gain. °· Sudden swelling of the hands, face, legs, and feet. °· Severe nausea  and vomiting. °· Numbness in the face, arms, legs, and feet. °· Dizziness. °· Urinating less than usual. °· Slurred speech. °· Convulsions or seizures. °How is this diagnosed? °There are no screening tests for preeclampsia. Your health care provider will ask you about symptoms and check for signs of preeclampsia during your prenatal visits. You may also have tests that include: °· Checking your blood pressure. °· Urine tests to check for protein. Your health care provider will check for this at every prenatal visit. °· Blood tests. °· Monitoring your baby's heart rate. °· Ultrasound. °How is this treated? °You and your health care provider will determine the treatment approach that is best for you. Treatment may include: °· Having more frequent prenatal exams to check for signs of preeclampsia, if you have an increased risk for preeclampsia. °· Medicine to lower your blood pressure. °· Staying in the hospital, if your condition is severe. There, treatment will focus on controlling your blood pressure and the amount of fluids in your body (fluid retention). °· Taking medicine (magnesium sulfate) to prevent seizures. This may be given as an injection or through an IV. °· Taking a low-dose aspirin during your pregnancy. °· Delivering your baby early. You may have your labor started with medicine (induced), or you may have a cesarean delivery. °Follow these instructions at home: °Eating and drinking ° °· Drink enough fluid to keep your urine pale yellow. °· Avoid caffeine. °Lifestyle °· Do not use any products that contain nicotine or tobacco, such as cigarettes and e-cigarettes. If you need help quitting, ask your health care provider. °· Do not use alcohol or drugs. °· Avoid stress as much as possible. Rest and get   plenty of sleep. °General instructions °· Take over-the-counter and prescription medicines only as told by your health care provider. °· When lying down, lie on your left side. This keeps pressure off your  major blood vessels. °· When sitting or lying down, raise (elevate) your feet. Try putting some pillows underneath your lower legs. °· Exercise regularly. Ask your health care provider what kinds of exercise are best for you. °· Keep all follow-up and prenatal visits as told by your health care provider. This is important. °How is this prevented? °There is no known way of preventing preeclampsia or eclampsia from developing. However, to lower your risk of complications and detect problems early: °· Get regular prenatal care. Your health care provider may be able to diagnose and treat the condition early. °· Maintain a healthy weight. Ask your health care provider for help managing weight gain during pregnancy. °· Work with your health care provider to manage any long-term (chronic) health conditions you have, such as diabetes or kidney problems. °· You may have tests of your blood pressure and kidney function after giving birth. °· Your health care provider may have you take low-dose aspirin during your next pregnancy. °Contact a health care provider if: °· You have symptoms that your health care provider told you may require more treatment or monitoring, such as: °? Headaches. °? Nausea or vomiting. °? Abdominal pain. °? Dizziness. °? Light-headedness. °Get help right away if: °· You have severe: °? Abdominal pain. °? Headaches that do not get better. °? Dizziness. °? Vision problems. °? Confusion. °? Nausea or vomiting. °· You have any of the following: °? A seizure. °? Sudden, rapid weight gain. °? Sudden swelling in your hands, ankles, or face. °? Trouble moving any part of your body. °? Numbness in any part of your body. °? Trouble speaking. °? Abnormal bleeding. °· You faint. °Summary °· Preeclampsia is a serious condition that may develop during pregnancy. °· This condition causes high blood pressure and increased protein in your urine along with other symptoms, such as headaches and vision  changes. °· Diagnosing and treating preeclampsia early is very important. If not treated early, it can cause serious problems for you and your baby. °· Get help right away if you have symptoms that your health care provider told you to watch for. °This information is not intended to replace advice given to you by your health care provider. Make sure you discuss any questions you have with your health care provider. °Document Released: 05/20/2000 Document Revised: 01/23/2018 Document Reviewed: 12/28/2015 °Elsevier Patient Education © 2020 [employer] ° °

## 2019-05-08 NOTE — Telephone Encounter (Signed)
Pt called and notified of lab results . Reviewed pre -E signs and symptoms. Follw up as scheduled.   Philip Aspen, CNM

## 2019-05-15 ENCOUNTER — Ambulatory Visit (INDEPENDENT_AMBULATORY_CARE_PROVIDER_SITE_OTHER): Payer: Self-pay | Admitting: Certified Nurse Midwife

## 2019-05-15 ENCOUNTER — Ambulatory Visit (INDEPENDENT_AMBULATORY_CARE_PROVIDER_SITE_OTHER): Payer: Self-pay

## 2019-05-15 ENCOUNTER — Telehealth: Payer: Self-pay

## 2019-05-15 ENCOUNTER — Other Ambulatory Visit: Payer: Self-pay

## 2019-05-15 VITALS — BP 148/102 | Wt 152.0 lb

## 2019-05-15 DIAGNOSIS — O24410 Gestational diabetes mellitus in pregnancy, diet controlled: Secondary | ICD-10-CM

## 2019-05-15 DIAGNOSIS — Z3A38 38 weeks gestation of pregnancy: Secondary | ICD-10-CM

## 2019-05-15 DIAGNOSIS — O365930 Maternal care for other known or suspected poor fetal growth, third trimester, not applicable or unspecified: Secondary | ICD-10-CM

## 2019-05-15 DIAGNOSIS — Z3483 Encounter for supervision of other normal pregnancy, third trimester: Secondary | ICD-10-CM

## 2019-05-15 LAB — POCT URINALYSIS DIPSTICK OB
Bilirubin, UA: NEGATIVE
Blood, UA: NEGATIVE
Glucose, UA: NEGATIVE
Ketones, UA: NEGATIVE
Leukocytes, UA: NEGATIVE
Nitrite, UA: NEGATIVE
POC,PROTEIN,UA: NEGATIVE
Spec Grav, UA: 1.01 (ref 1.010–1.025)
Urobilinogen, UA: 0.2 E.U./dL
pH, UA: 6 (ref 5.0–8.0)

## 2019-05-15 NOTE — Patient Instructions (Signed)

## 2019-05-15 NOTE — Addendum Note (Signed)
Addended by: Hildred Priest on: 05/15/2019 11:26 AM   Modules accepted: Orders, SmartSet

## 2019-05-15 NOTE — Progress Notes (Signed)
ROB , NST and U/s today for GDM diet and IUGR. U/s results reviwed with pt (see below) . NST reactive, basline 150, moderate variability, accels present, decels absent. Irritability noted on toco. Discussed plan of care of Induction at 39 wks. She verbalizes and agrees to plan. Will work on getting her scheduled and let her know of date and time. BS fasting reviewed70-90's.  Discussed covid testing. Return Monday for NST/ROB .   Brenda Rogers, CNM  Patient Name: Brenda Rogers DOB: 28-Feb-1986 MRN: [id number]  ULTRASOUND REPORT  Location: [facility] Date of Service: 05/15/2019   Indications:growth/afi   Findings:  Brenda Rogers intrauterine pregnancy is visualized with FHR at 149 BPM. Biometrics give an (U/S) Gestational age of [redacted]w[redacted]d and an (U/S) EDD of 06/25/2019; this does not correlates with the clinically established Estimated Date of Delivery: 05/26/19.   Fetal presentation is Cephalic.  Placenta: posterior. Grade: 3 AFI: 16 cm   Growth percentile is 8%.  EFW: 2703 g ( 5 lbs 15 oz)   Umbilical Artery Dopplers were performed due to fetal growth restriction.  Systolic and Diastolic blood flow in each umbilical artery appear normal and without reversal or absence of diastolic flow.   The maximum S/D ratio is 2.67.   According to perinatology.com, this ratio is normal for this gestational age.  Impression: 1. [redacted]w[redacted]d Viable Singleton Intrauterine pregnancy previously established criteria. 2. Growth is 8 %ile.  AFI is 16 cm.   Recommendations: 1.Clinical correlation with the patient's History and Physical Exam.   Brenda  M. Albertine Rogers     RDMS

## 2019-05-15 NOTE — Progress Notes (Signed)
NONSTRESS TEST INTERPRETATION  INDICATIONS:   FHR baseline:  RESULTS: COMMENTS:    PLAN: 1. Continue fetal kick counts twice a day. 2. Continue antepartum testing as scheduled-Biweekly 3    

## 2019-05-15 NOTE — Telephone Encounter (Signed)
Spoke with patient - per AT changed her appointments from 05/21/19 to 05/20/19. Pateint will have NST first and then OB visit. She will then go get COVID 19 testing. She is to report to the ED at [facility] on 05/21/19 at midnight. Patient expressed understanding.

## 2019-05-16 ENCOUNTER — Telehealth: Payer: Self-pay | Admitting: Certified Nurse Midwife

## 2019-05-16 ENCOUNTER — Telehealth: Payer: Self-pay

## 2019-05-16 NOTE — Telephone Encounter (Signed)
Returned patients call- if water breaks report to hospital if after hours. Report to the ED at 12:00 midnight on 05/21/19. 12:01AM on 05/22/19 be in ED. Check in through ED- then you will be taken upstairs to L&D. Patient expressed understanding.

## 2019-05-16 NOTE — Telephone Encounter (Signed)
mychart message sent

## 2019-05-16 NOTE — Telephone Encounter (Signed)
Pt called in she has some questions about being  Induced. Pt is requesting a call back. Please advise

## 2019-05-18 ENCOUNTER — Other Ambulatory Visit: Payer: Self-pay

## 2019-05-18 ENCOUNTER — Inpatient Hospital Stay
Admission: EM | Admit: 2019-05-18 | Discharge: 2019-05-20 | DRG: 806 | Disposition: A | Discharge status: Home / Self Care | Payer: Self-pay | Attending: Certified Nurse Midwife | Admitting: Certified Nurse Midwife

## 2019-05-18 ENCOUNTER — Encounter: Payer: Self-pay | Admitting: Obstetrics and Gynecology

## 2019-05-18 DIAGNOSIS — O24420 Gestational diabetes mellitus in childbirth, diet controlled: Secondary | ICD-10-CM

## 2019-05-18 DIAGNOSIS — O99019 Anemia complicating pregnancy, unspecified trimester: Secondary | ICD-10-CM

## 2019-05-18 DIAGNOSIS — D649 Anemia, unspecified: Secondary | ICD-10-CM | POA: Diagnosis present

## 2019-05-18 DIAGNOSIS — O9902 Anemia complicating childbirth: Secondary | ICD-10-CM | POA: Diagnosis present

## 2019-05-18 DIAGNOSIS — Z3A38 38 weeks gestation of pregnancy: Secondary | ICD-10-CM

## 2019-05-18 DIAGNOSIS — Z20828 Contact with and (suspected) exposure to other viral communicable diseases: Secondary | ICD-10-CM | POA: Diagnosis present

## 2019-05-18 DIAGNOSIS — O24410 Gestational diabetes mellitus in pregnancy, diet controlled: Secondary | ICD-10-CM

## 2019-05-18 DIAGNOSIS — O134 Gestational [pregnancy-induced] hypertension without significant proteinuria, complicating childbirth: Secondary | ICD-10-CM

## 2019-05-18 DIAGNOSIS — O1002 Pre-existing essential hypertension complicating childbirth: Secondary | ICD-10-CM | POA: Diagnosis present

## 2019-05-18 DIAGNOSIS — R7309 Other abnormal glucose: Secondary | ICD-10-CM

## 2019-05-18 DIAGNOSIS — D573 Sickle-cell trait: Secondary | ICD-10-CM | POA: Diagnosis present

## 2019-05-18 DIAGNOSIS — O24429 Gestational diabetes mellitus in childbirth, unspecified control: Secondary | ICD-10-CM | POA: Diagnosis present

## 2019-05-18 LAB — RESPIRATORY PANEL BY RT PCR (FLU A&B, COVID)
Influenza A by PCR: NEGATIVE
Influenza B by PCR: NEGATIVE
SARS Coronavirus 2 by RT PCR: NEGATIVE

## 2019-05-18 LAB — CBC
HCT: 41.5 % (ref 36.0–46.0)
Hemoglobin: 14.4 g/dL (ref 12.0–15.0)
MCH: 30.1 pg (ref 26.0–34.0)
MCHC: 34.7 g/dL (ref 30.0–36.0)
MCV: 86.8 fL (ref 80.0–100.0)
Platelets: 161 10*3/uL (ref 150–400)
RBC: 4.78 MIL/uL (ref 3.87–5.11)
RDW: 16.8 % — ABNORMAL HIGH (ref 11.5–15.5)
WBC: 13.5 10*3/uL — ABNORMAL HIGH (ref 4.0–10.5)
nRBC: 0 % (ref 0.0–0.2)

## 2019-05-18 LAB — GLUCOSE, RANDOM: Glucose, Bld: 79 mg/dL (ref 70–99)

## 2019-05-18 LAB — PREPARE RBC (CROSSMATCH)

## 2019-05-18 LAB — ABO/RH: ABO/RH(D): B POS

## 2019-05-18 MED ORDER — BUTORPHANOL TARTRATE 1 MG/ML IJ SOLN: 1.0000 mg | INTRAMUSCULAR | Status: DC | PRN

## 2019-05-18 MED ORDER — OXYTOCIN BOLUS FROM INFUSION
500.0000 mL | Freq: Once | INTRAVENOUS | Status: DC
  Administered 2019-05-18: 500 mL via INTRAVENOUS

## 2019-05-18 MED ORDER — CARBOPROST TROMETHAMINE 250 MCG/ML IM SOLN
INTRAMUSCULAR | Status: AC
  Filled 2019-05-18: qty 1

## 2019-05-18 MED ORDER — BENZOCAINE-MENTHOL 20-0.5 % EX AERO: 1.0000 "application " | INHALATION_SPRAY | CUTANEOUS | Status: DC | PRN

## 2019-05-18 MED ORDER — OXYTOCIN 40 UNITS IN NORMAL SALINE INFUSION - SIMPLE MED
2.5000 [IU]/h | INTRAVENOUS | Status: DC | PRN
  Administered 2019-05-18: 18:00:00 2.5 [IU]/h via INTRAVENOUS

## 2019-05-18 MED ORDER — DIPHENHYDRAMINE HCL 25 MG PO CAPS: 25.0000 mg | ORAL_CAPSULE | Freq: Four times a day (QID) | ORAL | Status: DC | PRN

## 2019-05-18 MED ORDER — LIDOCAINE HCL (PF) 1 % IJ SOLN
INTRAMUSCULAR | Status: AC
  Filled 2019-05-18: qty 30

## 2019-05-18 MED ORDER — OXYCODONE-ACETAMINOPHEN 5-325 MG PO TABS: 1.0000 | ORAL_TABLET | ORAL | Status: DC | PRN

## 2019-05-18 MED ORDER — CARBOPROST TROMETHAMINE 250 MCG/ML IM SOLN
INTRAMUSCULAR | Status: AC
  Administered 2019-05-18: 20:00:00 250 ug
  Filled 2019-05-18: qty 1

## 2019-05-18 MED ORDER — DOCUSATE SODIUM 100 MG PO CAPS
100.0000 mg | ORAL_CAPSULE | Freq: Two times a day (BID) | ORAL | Status: DC
  Administered 2019-05-19 – 2019-05-20 (×3): 100 mg via ORAL
  Filled 2019-05-18 (×3): qty 1

## 2019-05-18 MED ORDER — AMMONIA AROMATIC IN INHA
RESPIRATORY_TRACT | Status: AC
  Filled 2019-05-18: qty 10

## 2019-05-18 MED ORDER — ONDANSETRON HCL 4 MG/2ML IJ SOLN: 4.0000 mg | Freq: Four times a day (QID) | INTRAMUSCULAR | Status: DC | PRN

## 2019-05-18 MED ORDER — SOD CITRATE-CITRIC ACID 500-334 MG/5ML PO SOLN: 30.0000 mL | ORAL | Status: DC | PRN

## 2019-05-18 MED ORDER — PRENATAL MULTIVITAMIN CH
1.0000 | ORAL_TABLET | Freq: Every day | ORAL | Status: DC
  Administered 2019-05-19: 1 via ORAL
  Filled 2019-05-18: qty 1

## 2019-05-18 MED ORDER — MISOPROSTOL 50MCG HALF TABLET: 50.0000 ug | ORAL_TABLET | ORAL | Status: DC | PRN

## 2019-05-18 MED ORDER — LACTATED RINGERS IV SOLN
INTRAVENOUS | Status: DC
  Administered 2019-05-18: 17:00:00 via INTRAVENOUS

## 2019-05-18 MED ORDER — LIDOCAINE HCL (PF) 1 % IJ SOLN: 30.0000 mL | INTRAMUSCULAR | Status: DC | PRN

## 2019-05-18 MED ORDER — TETANUS-DIPHTH-ACELL PERTUSSIS 5-2.5-18.5 LF-MCG/0.5 IM SUSP
0.5000 mL | Freq: Once | INTRAMUSCULAR | Status: DC
  Filled 2019-05-18: qty 0.5

## 2019-05-18 MED ORDER — MISOPROSTOL 200 MCG PO TABS
ORAL_TABLET | ORAL | Status: AC
  Filled 2019-05-18: qty 4

## 2019-05-18 MED ORDER — OXYTOCIN 10 UNIT/ML IJ SOLN
INTRAMUSCULAR | Status: AC
  Filled 2019-05-18: qty 2

## 2019-05-18 MED ORDER — LACTATED RINGERS IV SOLN: 500.0000 mL | INTRAVENOUS | Status: DC | PRN

## 2019-05-18 MED ORDER — TERBUTALINE SULFATE 1 MG/ML IJ SOLN: 0.2500 mg | Freq: Once | INTRAMUSCULAR | Status: DC | PRN

## 2019-05-18 MED ORDER — ACETAMINOPHEN 325 MG PO TABS: 650.0000 mg | ORAL_TABLET | ORAL | Status: DC | PRN

## 2019-05-18 MED ORDER — OXYTOCIN 40 UNITS IN NORMAL SALINE INFUSION - SIMPLE MED: 2.5000 [IU]/h | INTRAVENOUS | Status: DC

## 2019-05-18 MED ORDER — IBUPROFEN 600 MG PO TABS
600.0000 mg | ORAL_TABLET | Freq: Four times a day (QID) | ORAL | Status: DC
  Filled 2019-05-18: qty 1

## 2019-05-18 MED ORDER — OXYTOCIN 40 UNITS IN NORMAL SALINE INFUSION - SIMPLE MED
INTRAVENOUS | Status: AC
  Filled 2019-05-18: qty 1000

## 2019-05-18 MED ORDER — ZOLPIDEM TARTRATE 5 MG PO TABS: 5.0000 mg | ORAL_TABLET | Freq: Every evening | ORAL | Status: DC | PRN

## 2019-05-18 MED ORDER — SODIUM CHLORIDE 0.9% IV SOLUTION: Freq: Once | INTRAVENOUS | Status: DC

## 2019-05-18 MED ORDER — SIMETHICONE 80 MG PO CHEW: 80.0000 mg | CHEWABLE_TABLET | ORAL | Status: DC | PRN

## 2019-05-18 NOTE — H&P (Signed)
History and Physical   HPI  Brenda Rogers is a 33 y.o. G2P0101 at [redacted]w[redacted]d Estimated Date of Delivery: 05/26/19 who is being admitted for labor management. Prenatal care complicated by gestational diabetes and HTN.    OB History  OB History  Gravida Para Term Preterm AB Living  2 1 0 1 0 1  SAB TAB Ectopic Multiple Live Births  0 0 0 0 1    # Outcome Date GA Lbr Len/2nd Weight Sex Delivery Anes PTL Lv  2 Current           1 Preterm 08/30/08 [redacted]w[redacted]d  1814 g F Vag-Spont  N LIV     Complications: Placenta Previa     Name: Brenda Rogers     PROBLEM LIST  Pregnancy complications or risks: Patient Active Problem List   Diagnosis Date Noted  . Labor and delivery, indication for care 05/18/2019  . Gestational diabetes 03/16/2019  . Anemia in pregnancy 03/11/2019  . Elevated glucose level 03/11/2019     Prenatal labs and studies: ABO, Rh: --/--/PENDING (12/12 1554) Antibody: PENDING (12/12 1554) Rubella: 5.81 (05/21 1205) RPR: Non Reactive (10/02 1018)  HBsAg: Negative (05/21 1205)  HIV: Non Reactive (05/21 1205)  GBS:--/Negative (11/30 1513)   Past Medical History:  Diagnosis Date  . Gestational diabetes   . Placenta previa   . Sickle cell trait ([place])      History reviewed. No pertinent surgical history.   Medications    Current Discharge Medication List    CONTINUE these medications which have NOT CHANGED   Details  ferrous sulfate 325 (65 FE) MG tablet Take 325 mg by mouth daily with breakfast.    Prenatal Vit-Fe Fumarate-FA (MULTIVITAMIN-PRENATAL) 27-0.8 MG TABS tablet Take 1 tablet by mouth daily at 12 noon.         Allergies  Patient has no known allergies.  Review of Systems  Pertinent items are noted in HPI.  Physical Exam  BP (!) 152/96   Pulse 93   Temp 98.6 F (37 C) (Oral)   Resp 20   Ht 4\' 10"  (1.473 m)   Wt 68.9 kg   LMP 08/14/2018 (Exact Date)   SpO2 96%   BMI 31.77 kg/m   Lungs:  CTA B Cardio: RRR without M/R/G Abd:  Soft, gravid, NT Presentation: cephalic EXT: No C/C/ 1+ Edema DTRs: 2+ B CERVIX: Dilation: 10 Dilation Complete Date: 05/18/19 Dilation Complete Time: 1643 Effacement (%): 100 Station: 0 Exam by:: LSE RN    See Prenatal records for more detailed PE.    Test Results  Results for orders placed or performed during the hospital encounter of 05/18/19 (from the past 24 hour(s))  CBC     Status: Abnormal   Collection Time: 05/18/19  3:54 PM  Result Value Ref Range   WBC 13.5 (H) 4.0 - 10.5 K/uL   RBC 4.78 3.87 - 5.11 MIL/uL   Hemoglobin 14.4 12.0 - 15.0 g/dL   HCT 41.5 36.0 - 46.0 %   MCV 86.8 80.0 - 100.0 fL   MCH 30.1 26.0 - 34.0 pg   MCHC 34.7 30.0 - 36.0 g/dL   RDW 16.8 (H) 11.5 - 15.5 %   Platelets 161 150 - 400 K/uL   nRBC 0.0 0.0 - 0.2 %  Glucose, serum     Status: None   Collection Time: 05/18/19  3:54 PM  Result Value Ref Range   Glucose, Bld 79 70 - 99 mg/dL  Type and screen  Status: None (Preliminary result)   Collection Time: 05/18/19  3:54 PM  Result Value Ref Range   ABO/RH(D) PENDING    Antibody Screen PENDING    Sample Expiration      05/21/2019,2359 Performed at [facility], [address] [place], [place] [address]   Respiratory Panel by RT PCR (Flu A&B, Covid) - Nasopharyngeal Swab     Status: None   Collection Time: 05/18/19  3:54 PM   Specimen: Nasopharyngeal Swab  Result Value Ref Range   SARS Coronavirus 2 by RT PCR NEGATIVE NEGATIVE   Influenza A by PCR NEGATIVE NEGATIVE   Influenza B by PCR NEGATIVE NEGATIVE     Assessment   G2P0101 at [redacted]w[redacted]d Estimated Date of Delivery: 05/26/19  The fetus is reassuring.    Patient Active Problem List   Diagnosis Date Noted  . Labor and delivery, indication for care 05/18/2019  . Gestational diabetes 03/16/2019  . Anemia in pregnancy 03/11/2019  . Elevated glucose level 03/11/2019    Plan  1. Admit to L&D :  2. EFM: -- Category 1 3. Epidural if desired.  Stadol for IV pain until  epidural requested. 4. Admission labs  5. Expect delivery  Elonda Husky, M.D. 05/18/2019 5:07 PM

## 2019-05-18 NOTE — Lactation Note (Signed)
This note was copied from a baby's chart. Lactation Consultation Note  Patient Name: Brenda Rogers Today's Date: 05/18/2019 Reason for consult: Follow-up assessment;Early term 37-38.6wks;Other (Comment)(Back to 2nd breast d/t low blood sugar of 29)  Assisted mom with first breast feeding in birthplace. Can easily hand express colostrum.  Baby latched to right breast with minimal assistance and began strong rhythmic sucking with swallows.  Mom could feel strong tug at the breast, but not pain.  She breast fed for 25 minutes.  Blood sugar was 29.  She was still rooting for the breast.  Put back to left breast for another 20 minutes of sucking.  Hand expressed 8 ml in short period.  Expressed colostrum given via finger feed using curved tip syringe.  Blood glucose after breast feeding on second breast and giving hand expressed colostrum was 54.  Explained feeding cues and supply and demand and encouraged mom to put baby to the breast whenever she demonstrated hunger cues.  Mom attempted breast feeding first baby but reports giving up early d/t poor latching and sore nipples.  Reviewed newborn stomach size, normal course of lactation and routine newborn feeding patterns.     Maternal Data Formula Feeding for Exclusion: No Has patient been taught Hand Expression?: Yes Does the patient have breastfeeding experience prior to this delivery?: Yes  Feeding Feeding Type: Breast Milk  LATCH Score Latch: Repeated attempts needed to sustain latch, nipple held in mouth throughout feeding, stimulation needed to elicit sucking reflex.  Audible Swallowing: A few with stimulation  Type of Nipple: Everted at rest and after stimulation  Comfort (Breast/Nipple): Soft / non-tender  Hold (Positioning): Assistance needed to correctly position infant at breast and maintain latch.  LATCH Score: 7  Interventions Interventions: Assisted with latch;Skin to skin;Breast massage;Hand express;Breast  compression;Adjust position;Support pillows;Expressed milk  Lactation Tools Discussed/Used WIC Program: (Self pay)   Consult Status Consult Status: Follow-up Date: 05/18/19 Follow-up type: Call as needed    Jarold Motto 05/18/2019, 8:30 PM

## 2019-05-18 NOTE — Progress Notes (Signed)
Patient ID: Brenda Rogers, female   DOB: July 22, 1985, 33 y.o.   MRN: [id number]  Called emergently for [facility].  Informed of large clots expressed.  Bladder emptied. (Had previously extensively examined placenta PP and was noted to be intact)  Upon my presentation with firm uterine pressure more clots were expressed. Pt was given IM Hemabate (Methergine contraindicated with HTN)  Sterile spec exam - cervix examined and noted to be intact.  Using curette the uterine cavity was explored and a large number of clots expressed.  (No placental tissue) Her bleeding immediately became slight - normal for PP.  We have now observed her for @30min  with no further evidence of significant bleeding.  Because of the large amount of blood lost - I discussed with the patient receiveing 2u pRBCs and she agrees with this plan.  I ordered 2 units of pRBCs to be given.    Plan CBC in AM.  Consider additional units of symptoms warrant.

## 2019-05-19 ENCOUNTER — Encounter: Payer: Self-pay | Admitting: Certified Nurse Midwife

## 2019-05-19 LAB — CBC WITH DIFFERENTIAL/PLATELET
Abs Immature Granulocytes: 0.05 10*3/uL (ref 0.00–0.07)
Basophils Absolute: 0 10*3/uL (ref 0.0–0.1)
Basophils Relative: 0 %
Eosinophils Absolute: 0 10*3/uL (ref 0.0–0.5)
Eosinophils Relative: 0 %
HCT: 35.1 % — ABNORMAL LOW (ref 36.0–46.0)
Hemoglobin: 11.8 g/dL — ABNORMAL LOW (ref 12.0–15.0)
Immature Granulocytes: 0 %
Lymphocytes Relative: 15 %
Lymphs Abs: 1.9 10*3/uL (ref 0.7–4.0)
MCH: 29 pg (ref 26.0–34.0)
MCHC: 33.6 g/dL (ref 30.0–36.0)
MCV: 86.2 fL (ref 80.0–100.0)
Monocytes Absolute: 0.9 10*3/uL (ref 0.1–1.0)
Monocytes Relative: 7 %
Neutro Abs: 9.7 10*3/uL — ABNORMAL HIGH (ref 1.7–7.7)
Neutrophils Relative %: 78 %
Platelets: 123 10*3/uL — ABNORMAL LOW (ref 150–400)
RBC: 4.07 MIL/uL (ref 3.87–5.11)
RDW: 17.8 % — ABNORMAL HIGH (ref 11.5–15.5)
WBC: 12.5 10*3/uL — ABNORMAL HIGH (ref 4.0–10.5)
nRBC: 0 % (ref 0.0–0.2)

## 2019-05-19 LAB — PREPARE RBC (CROSSMATCH)

## 2019-05-19 LAB — RPR: RPR Ser Ql: NONREACTIVE

## 2019-05-19 MED ORDER — SODIUM CHLORIDE 0.9% IV SOLUTION: Freq: Once | INTRAVENOUS | Status: DC

## 2019-05-19 NOTE — Progress Notes (Signed)
Patient ID: Brenda Rogers, female   DOB: [birth year], 33 y.o.   MRN: [id number]  Progress Note - Vaginal Delivery  Brenda Rogers is a 33 y.o. G2P1102 now PP day 1 s/p Vaginal, Spontaneous with subsequent PPH.  Subjective:  The patient reports no complaints She is not lightheaded, but has not been OOB yet.  Objective:  Vital signs in last 24 hours: Temp:  [98.2 F (36.8 C)-99.3 F (37.4 C)] 98.3 F (36.8 C) (12/13 0522) Pulse Rate:  [71-130] 88 (12/13 0522) Resp:  [16-20] 20 (12/13 0522) BP: (104-164)/(59-104) 140/94 (12/13 0522) SpO2:  [96 %-100 %] 98 % (12/13 0522) Weight:  [68.9 kg] 68.9 kg (12/12 1554)  Physical Exam:  General: alert, cooperative and no distress Lochia: appropriate Uterine Fundus: firm    Data Review Recent Labs    05/18/19 1554 05/19/19 0531  HGB 14.4 11.8*  HCT 41.5 35.1*    Assessment/Plan: Pt received 2u pPRBCs for PPH.  Bleeding nl PP since all clots expressed from uterus. Pt now stable and normal PP course expected.  -- Follow S&S of anemia and no further blood products if pt remains asymptomatic.  -- Continue routine PP care.     Finis Bud, M.D. 05/19/2019 7:05 AM

## 2019-05-19 NOTE — Plan of Care (Signed)
Transferred to room 334. Alert and oriented with aprop. Affect. Color good, skin w&d, BBS clear. Denies c/o. Oriented to room POC and POC for Baby;v/o.

## 2019-05-20 ENCOUNTER — Other Ambulatory Visit: Payer: Self-pay

## 2019-05-20 ENCOUNTER — Encounter: Payer: Self-pay | Admitting: Certified Nurse Midwife

## 2019-05-20 LAB — BPAM RBC
Blood Product Expiration Date: 202101122359
Blood Product Expiration Date: 202101122359
ISSUE DATE / TIME: 202012122147
ISSUE DATE / TIME: 202012130056
Unit Type and Rh: 7300
Unit Type and Rh: 7300

## 2019-05-20 LAB — TYPE AND SCREEN
ABO/RH(D): B POS
Antibody Screen: NEGATIVE
Unit division: 0
Unit division: 0

## 2019-05-20 MED ORDER — COCONUT OIL OIL
1.0000 "application " | TOPICAL_OIL | Status: DC | PRN
  Administered 2019-05-20: 1 via TOPICAL

## 2019-05-20 NOTE — Progress Notes (Signed)
Pt. Requested Pacifier. Educated Mom in recommendation to defer Pacifier use until Breast feeding is well established; that use may effect correct Infant Latch . I also discussed with Mom the risk of missing feeding cues and decrease in milk production with Pacifier use. I also dicussed with Mom that with this information , she still had the right to decide if she wanted to introduce Pacifier at this time and one would be provided if she requested one. Pt. V/o and stated she will "think about it."

## 2019-05-20 NOTE — Discharge Instructions (Signed)

## 2019-05-20 NOTE — Discharge Summary (Signed)
                              Discharge Summary  Date of Admission: 05/18/2019  Date of Discharge: 05/20/2019  Admitting Diagnosis: Onset of Labor at [redacted]w[redacted]d  Mode of Delivery: normal spontaneous vaginal delivery                 Discharge Diagnosis: Post partum hemorrhage   Intrapartum Procedures:    Post partum procedures: blood transfusion  Complications: hemorrhage                      Discharge Day SOAP Note:  Progress Note - Vaginal Delivery  Brenda Rogers is a 33 y.o. G2P1102 now PP day 2 s/p Vaginal, Spontaneous . Delivery was complicated by gestational diabetes and hypertension  Subjective  The patient has the following complaints: has no unusual complaints  No lightheadedness  Pain is controlled with current medications.   Patient is urinating without difficulty.  She is ambulating well.     Objective  Vital signs: BP (!) 143/92 (BP Location: Right Arm)   Pulse 90   Temp 98.4 F (36.9 C) (Oral)   Resp 18   Ht 4\' 10"  (1.473 m)   Wt 68.9 kg   LMP 08/14/2018 (Exact Date)   SpO2 97%   Breastfeeding Unknown   BMI 31.77 kg/m   Physical Exam: Gen: NAD Fundus Fundal Tone: Firm  Lochia Amount: Small  Perineum Appearance: Edematous, Approximated     Data Review Labs: CBC Latest Ref Rng & Units 05/19/2019 05/18/2019 05/07/2019  WBC 4.0 - 10.5 K/uL 12.5(H) 13.5(H) 9.9  Hemoglobin 12.0 - 15.0 g/dL 11.8(L) 14.4 12.9  Hematocrit 36.0 - 46.0 % 35.1(L) 41.5 39.0  Platelets 150 - 400 K/uL 123(L) 161 154   B POS Performed at [facility], [address] [place], [place] [address]   Assessment/Plan  Active Problems:   Labor and delivery, indication for care    Plan for discharge today.   Discharge Instructions: Per After Visit Summary. Activity: Advance as tolerated. Pelvic rest for 6 weeks.  Also refer to After Visit Summary Diet: Regular Medications: Allergies as of 05/20/2019   No Known Allergies     Medication List    STOP taking  these medications   ferrous sulfate 325 (65 FE) MG tablet     TAKE these medications   multivitamin-prenatal 27-0.8 MG Tabs tablet Take 1 tablet by mouth daily at 12 noon.      Outpatient follow up:  Follow-up Information    Philip Aspen, CNM Follow up in 6 week(s).   Specialties: Certified Nurse Midwife, Radiology Contact information: [address] [place] [address] [phone]          Postpartum contraception: Will discuss at first office visit post-partum  Discharged Condition: good  Discharged to: home  Newborn Data: Disposition:home with mother  Apgars: APGAR (1 MIN): 8   APGAR (5 MINS): 9   APGAR (10 MINS):    Baby Feeding: Breast    Finis Bud, M.D. 05/20/2019 9:42 AM

## 2019-05-20 NOTE — Progress Notes (Signed)
Discharge order received from doctor. Reviewed discharge instructions (no prescriptions sent home with patient) with patient and answered all questions. Follow up appointment instructions given. Patient verbalized understanding. ID bands checked. Patient discharged home with infant via wheelchair by nursing/auxillary.    Hilbert Bible, RN

## 2019-05-21 ENCOUNTER — Encounter: Payer: Self-pay | Admitting: Certified Nurse Midwife

## 2019-05-21 ENCOUNTER — Other Ambulatory Visit: Payer: Self-pay

## 2019-07-02 ENCOUNTER — Ambulatory Visit (INDEPENDENT_AMBULATORY_CARE_PROVIDER_SITE_OTHER): Payer: Self-pay | Admitting: Certified Nurse Midwife

## 2019-07-02 ENCOUNTER — Encounter: Payer: Self-pay | Admitting: Obstetrics and Gynecology

## 2019-07-02 ENCOUNTER — Encounter: Payer: Self-pay | Admitting: Certified Nurse Midwife

## 2019-07-02 ENCOUNTER — Other Ambulatory Visit: Payer: Self-pay

## 2019-07-02 DIAGNOSIS — Z1332 Encounter for screening for maternal depression: Secondary | ICD-10-CM

## 2019-07-02 LAB — POCT URINALYSIS DIPSTICK
Bilirubin, UA: NEGATIVE
Blood, UA: NEGATIVE
Glucose, UA: NEGATIVE
Leukocytes, UA: NEGATIVE
Nitrite, UA: NEGATIVE
Protein, UA: NEGATIVE
Spec Grav, UA: 1.025 (ref 1.010–1.025)
Urobilinogen, UA: 0.2 E.U./dL
pH, UA: 5 (ref 5.0–8.0)

## 2019-07-02 MED ORDER — NIFEDIPINE ER OSMOTIC RELEASE 30 MG PO TB24: 30.0000 mg | ORAL_TABLET | Freq: Every day | ORAL | 0 refills | Status: DC

## 2019-07-02 NOTE — Patient Instructions (Signed)

## 2019-07-02 NOTE — Progress Notes (Signed)
Subjective:    Brenda Rogers is a 34 y.o. G34P1102 Hispanic female who presents for a postpartum visit. She is 6 weeks postpartum following a spontaneous vaginal delivery at 386 gestational weeks. Anesthesia: none. I have fully reviewed the prenatal and intrapartum course. Postpartum course has been WNL. Baby's course has been WNL. Baby is feeding by breast and bottle . Bleeding no bleeding. Bowel function is normal. Bladder function is normal. Patient is not sexually active. Contraception method is . Postpartum depression screening: negative. Score 0.  Last pap 2018 and was normal  BP elevated: 169/106 162/100 Rpt after on side, 15 min 146/92  She denies headache, visual changes or other symptoms. State she is a little stressed.   The following portions of the patient's history were reviewed and updated as appropriate: allergies, current medications, past medical history, past surgical history and problem list.  Review of Systems Pertinent items are noted in HPI.   Vitals:   07/02/19 1424  Weight: 124 lb 9 oz (56.5 kg)  Height: 4\' 10"  (1.473 m)   No LMP recorded.  Objective:   General:  alert, cooperative and no distress   Breasts:  deferred, no complaints  Lungs: clear to auscultation bilaterally  Heart:  regular rate and rhythm  Abdomen: soft, nontender   Vulva: normal  Vagina: normal vagina  Cervix:  closed  Corpus: Well-involuted  Adnexa:  Non-palpable  Rectal Exam: no hemorrhoids        Assessment:   Postpartum exam 6 wks s/p SVD Breast and bottle feeding Depression screening Contraception counseling   Plan:  : condoms. BP elevated, consulted Dr. . Procardia 30 mg po daily ordered. Red flag symptoms reviewed.  Follow up in: 3 days for BP check  or earlier if needed  Valentino Saxon, CNM  CNM

## 2019-07-05 ENCOUNTER — Ambulatory Visit: Payer: Self-pay | Admitting: Certified Nurse Midwife

## 2019-07-05 ENCOUNTER — Other Ambulatory Visit: Payer: Self-pay

## 2019-07-05 VITALS — BP 187/101 | HR 88

## 2019-07-05 DIAGNOSIS — Z0130 Encounter for examination of blood pressure without abnormal findings: Secondary | ICD-10-CM

## 2019-07-05 NOTE — Progress Notes (Signed)
After communicating with patient via mychart patient reports she had not been taking the Procardia 30 mg QD as prescribed by Doreene Burke CNM on 07/02/19. The patient was asked to schedule a NV for today which the receptionist scheduled on 06/06/22. After speaking with Pattricia Boss the patient was asked to come to the office for a BP check. Patient's BP was 187/101. Patient denies headache, blurred vision and edema. It was strongly suggested she take the medicine every single day and at the same time. Patient agreed. Discussed the ramifications of untreated hypertension. Patient expressed understanding. Patient will return to the office on Monday 06/07/19 for repeat BP. She was also advised to seek emergent care if she developed an intractable headache or blurred vision. Patient was in agreement.

## 2019-07-08 ENCOUNTER — Other Ambulatory Visit: Payer: Self-pay

## 2019-07-08 ENCOUNTER — Ambulatory Visit (INDEPENDENT_AMBULATORY_CARE_PROVIDER_SITE_OTHER): Payer: Self-pay | Admitting: Certified Nurse Midwife

## 2019-07-08 ENCOUNTER — Telehealth: Payer: Self-pay

## 2019-07-08 VITALS — BP 147/92 | HR 127 | Ht <= 58 in | Wt 118.5 lb

## 2019-07-08 DIAGNOSIS — Z0130 Encounter for examination of blood pressure without abnormal findings: Secondary | ICD-10-CM

## 2019-07-08 NOTE — Telephone Encounter (Signed)
mychart message sent to patient- per Doreene Burke CNM increase procardia to 30mg  BID. Repeat blood pressure check on Friday 07/12/19

## 2019-07-08 NOTE — Progress Notes (Signed)
Patient here for blood pressure check.  Patient denies headaches, visual changes or swelling.  Patient stated Procardia 30MG  tablet once daily on 1/29.    BP (!) 147/92   Pulse (!) 127   Ht 4\' 10"  (1.473 m)   Wt 118 lb 8 oz (53.8 kg)   Breastfeeding Yes   BMI 24.77 kg/m

## 2019-07-12 ENCOUNTER — Telehealth: Payer: Self-pay

## 2019-07-12 ENCOUNTER — Other Ambulatory Visit: Payer: Self-pay

## 2019-07-12 ENCOUNTER — Ambulatory Visit (INDEPENDENT_AMBULATORY_CARE_PROVIDER_SITE_OTHER): Payer: Self-pay | Admitting: Certified Nurse Midwife

## 2019-07-12 VITALS — BP 133/90 | HR 102 | Ht <= 58 in | Wt 120.2 lb

## 2019-07-12 DIAGNOSIS — Z0130 Encounter for examination of blood pressure without abnormal findings: Secondary | ICD-10-CM

## 2019-07-12 NOTE — Telephone Encounter (Signed)
Spoke with pt she is taking the procardia xl 30 mg twice a day.

## 2019-07-12 NOTE — Progress Notes (Signed)
Pt present for blood pressure check. Pt is currently taking Procardia XL for increased bp after pregnancy. Patient denies headaches, visual changes or swelling. Pt stated taking Procardia XL daily but has not been checking her bp daily.   BP 133/90   Pulse (!) 102   Ht 4\' 10"  (1.473 m)   Wt 120 lb 3.2 oz (54.5 kg)   BMI 25.12 kg/m

## 2019-07-13 ENCOUNTER — Other Ambulatory Visit: Payer: Self-pay | Admitting: Certified Nurse Midwife

## 2019-07-13 MED ORDER — NIFEDIPINE ER OSMOTIC RELEASE 30 MG PO TB24: 30.0000 mg | ORAL_TABLET | Freq: Two times a day (BID) | ORAL | 1 refills | Status: DC

## 2019-07-13 NOTE — Progress Notes (Signed)
Consult with Dr. Logan Bores regarding pt plan of care. Pt BP stable . Recommend pt follow up with PCP for long term management of her hypertension.   Doreene Burke, CNM

## 2019-07-22 ENCOUNTER — Other Ambulatory Visit: Payer: Self-pay

## 2019-07-22 MED ORDER — NIFEDIPINE ER OSMOTIC RELEASE 30 MG PO TB24: 30.0000 mg | ORAL_TABLET | Freq: Two times a day (BID) | ORAL | 1 refills | Status: DC

## 2019-07-22 NOTE — Telephone Encounter (Signed)
Refill sent to [facility] per patient request. Mychart message sent to patient

## 2019-08-19 ENCOUNTER — Other Ambulatory Visit: Payer: Self-pay

## 2019-08-19 MED ORDER — NIFEDIPINE ER OSMOTIC RELEASE 30 MG PO TB24: 30.0000 mg | ORAL_TABLET | Freq: Two times a day (BID) | ORAL | 1 refills | Status: DC

## 2019-08-19 NOTE — Telephone Encounter (Signed)
Refill on Nifedipine 30mg  24 hour # 60 x 1 sent to preferred pharmacy per patient request. Mychart message sent to patient.

## 2019-08-30 ENCOUNTER — Ambulatory Visit: Payer: Self-pay | Attending: Internal Medicine

## 2019-08-30 DIAGNOSIS — Z23 Encounter for immunization: Secondary | ICD-10-CM

## 2019-08-30 NOTE — Progress Notes (Signed)
   Covid-19 Vaccination Clinic  Name:  Sherman Lipuma    MRN: [id number] DOB: 1985-09-02  08/30/2019  [name] was observed post Covid-19 immunization for 15 minutes without incident. She was provided with Vaccine Information Sheet and instruction to access the V-Safe system.   Ms. Harkless was instructed to call 911 with any severe reactions post vaccine: Marland Kitchen Difficulty breathing  . Swelling of face and throat  . A fast heartbeat  . A bad rash all over body  . Dizziness and weakness   Immunizations Administered    Name Date Dose VIS Date Route   Pfizer COVID-19 Vaccine 08/30/2019  2:19 PM 0.3 mL 05/17/2019 Intramuscular   Manufacturer: [employer], [employer]   Lot: [id number]   NDC: [address]-8

## 2019-09-12 ENCOUNTER — Other Ambulatory Visit: Payer: Self-pay | Admitting: Certified Nurse Midwife

## 2019-09-24 ENCOUNTER — Ambulatory Visit: Payer: Self-pay | Attending: Internal Medicine

## 2019-09-24 DIAGNOSIS — Z23 Encounter for immunization: Secondary | ICD-10-CM

## 2019-09-24 NOTE — Progress Notes (Signed)
   Covid-19 Vaccination Clinic  Name:  Brenda Rogers    MRN: [id number] DOB: 04-25-1986  09/24/2019  Brenda Rogers was observed post Covid-19 immunization for 15 minutes without incident. She was provided with Vaccine Information Sheet and instruction to access the V-Safe system.   Brenda Rogers was instructed to call 911 with any severe reactions post vaccine: Marland Kitchen Difficulty breathing  . Swelling of face and throat  . A fast heartbeat  . A bad rash all over body  . Dizziness and weakness   Immunizations Administered    Name Date Dose VIS Date Route   Pfizer COVID-19 Vaccine 09/24/2019 12:42 PM 0.3 mL 07/31/2018 Intramuscular   Manufacturer: [employer], [employer]   Lot: [id number]   NDC: [address]-0

## 2019-10-03 ENCOUNTER — Telehealth: Payer: Self-pay | Admitting: Certified Nurse Midwife

## 2019-10-03 NOTE — Telephone Encounter (Signed)
Pt called in and stated that she made a pcp appointment.... the pt was calling to get a refill of her blood pressure meds, sent to [facility] [address]. Please advise

## 2019-10-04 ENCOUNTER — Other Ambulatory Visit: Payer: Self-pay

## 2019-10-04 MED ORDER — NIFEDIPINE ER OSMOTIC RELEASE 30 MG PO TB24: 30.0000 mg | ORAL_TABLET | Freq: Two times a day (BID) | ORAL | 1 refills | Status: DC

## 2019-10-04 NOTE — Telephone Encounter (Signed)
Refill sent to preferred pharmacy. 

## 2019-10-07 ENCOUNTER — Other Ambulatory Visit: Payer: Self-pay

## 2019-10-07 MED ORDER — NIFEDIPINE ER OSMOTIC RELEASE 30 MG PO TB24: 30.0000 mg | ORAL_TABLET | Freq: Two times a day (BID) | ORAL | 1 refills | Status: DC

## 2019-10-07 NOTE — Telephone Encounter (Signed)
Per patient request- refill [facility] to [facility].

## 2019-12-30 ENCOUNTER — Encounter: Payer: Self-pay | Admitting: Certified Nurse Midwife

## 2020-06-06 NOTE — L&D Delivery Note (Signed)
Delivery Note  Brenda Rogers is a [id number] at [redacted]w[redacted]d with an LMP of 07/16/20, consistent with [place] at [redacted]w[redacted]d.   First Stage: Labor onset: 1600 Induction: oxytocin Analgesia /Anesthesia intrapartum: none AROM at 1528  Second Stage: Complete dilation at 1748 Onset of pushing at 1748 FHR second stage 145 bpm, moderate variability, variables with contractions  Delivery of a viable baby girl on 04/14/2021  at 1751 by CNM Delivery of fetal head in OA position with restitution to LOA. tight nuchal cord identified, baby delivered through;  Anterior then posterior shoulders delivered easily with gentle downward traction. Baby placed on mom's chest, and attended to by baby RN Cord double clamped after cessation of pulsation, cut by Father of baby  Cord blood sample collection: No B POS Collection of cord blood donation N/A Arterial cord blood sample N/A  Third Stage: Oxytocin bolus started after delivery of infant for hemorrhage prophylaxis  Placenta delivered  intact with 3 VC @ 1754 Placenta disposition: discarded Uterine tone firm / bleeding small Cytotec given for prophylaxis d/t hx of PPH  no laceration identified  Anesthesia for repair: N/A Repair N/A Est. Blood Loss (mL): 200  Complications: none  Mom to postpartum.  Baby to Couplet care / Skin to Skin.  Newborn: Information for the patient's newborn:  Cragin, Girl Shereece [[id number]]  Live born female  Birth Weight:   APGAR: 8, 8  Newborn Delivery   Birth date/time: 04/14/2021 17:51:00 Delivery type: Vaginal, Spontaneous        Feeding planned: Breast  ---------- Chari Manning, CNM Certified Nurse Midwife [facility]

## 2020-10-05 DIAGNOSIS — O0990 Supervision of high risk pregnancy, unspecified, unspecified trimester: Secondary | ICD-10-CM | POA: Insufficient documentation

## 2020-10-09 LAB — OB RESULTS CONSOLE RUBELLA ANTIBODY, IGM: Rubella: IMMUNE

## 2020-10-09 LAB — OB RESULTS CONSOLE HEPATITIS B SURFACE ANTIGEN: Hepatitis B Surface Ag: NEGATIVE

## 2020-10-09 LAB — OB RESULTS CONSOLE VARICELLA ZOSTER ANTIBODY, IGG: Varicella: IMMUNE

## 2020-11-19 ENCOUNTER — Ambulatory Visit: Payer: Self-pay

## 2020-12-23 ENCOUNTER — Other Ambulatory Visit: Payer: Self-pay | Admitting: Certified Nurse Midwife

## 2020-12-23 ENCOUNTER — Other Ambulatory Visit: Payer: Self-pay | Admitting: Maternal & Fetal Medicine

## 2020-12-23 DIAGNOSIS — O10919 Unspecified pre-existing hypertension complicating pregnancy, unspecified trimester: Secondary | ICD-10-CM

## 2020-12-24 ENCOUNTER — Other Ambulatory Visit: Payer: Self-pay

## 2020-12-24 ENCOUNTER — Ambulatory Visit: Payer: BC Managed Care – PPO | Attending: Maternal & Fetal Medicine

## 2020-12-24 ENCOUNTER — Ambulatory Visit (HOSPITAL_BASED_OUTPATIENT_CLINIC_OR_DEPARTMENT_OTHER): Payer: BC Managed Care – PPO | Admitting: Maternal & Fetal Medicine

## 2020-12-24 VITALS — BP 126/79 | HR 104 | Temp 98.3°F | Ht <= 58 in | Wt 127.0 lb

## 2020-12-24 DIAGNOSIS — O0992 Supervision of high risk pregnancy, unspecified, second trimester: Secondary | ICD-10-CM

## 2020-12-24 DIAGNOSIS — Z3A23 23 weeks gestation of pregnancy: Secondary | ICD-10-CM

## 2020-12-24 DIAGNOSIS — O10912 Unspecified pre-existing hypertension complicating pregnancy, second trimester: Secondary | ICD-10-CM

## 2020-12-24 DIAGNOSIS — Z87898 Personal history of other specified conditions: Secondary | ICD-10-CM

## 2020-12-24 DIAGNOSIS — O10919 Unspecified pre-existing hypertension complicating pregnancy, unspecified trimester: Secondary | ICD-10-CM

## 2020-12-24 DIAGNOSIS — Z8759 Personal history of other complications of pregnancy, childbirth and the puerperium: Secondary | ICD-10-CM

## 2020-12-24 DIAGNOSIS — O09292 Supervision of pregnancy with other poor reproductive or obstetric history, second trimester: Secondary | ICD-10-CM | POA: Insufficient documentation

## 2020-12-24 DIAGNOSIS — O09899 Supervision of other high risk pregnancies, unspecified trimester: Secondary | ICD-10-CM

## 2020-12-24 NOTE — Progress Notes (Signed)
MFM Consultation  Reason for visit: Chronic hypertension Requesting provider: Haroldine Rogers, CNM Date of Service: 12/24/20  Brenda Rogers is a 84 G3P2 who is here at 23 wk 0 for diagnosis of chronic hypertension in pregnancy. She is seen today at the [facility] of Brenda [place], [place].  She has an EDD of 04/22/21 confirmed by early ultrasound.  Her prenatal care has been uneventful. She has had a low risk NIPS. Her prenatal labs are normal.  Her pregnancy issues include:  1) Chronic hypertension on medication: She is taking procardia and low dose ASA. Her blood pressure is normal today. She does not have end organ damage.  2) History of GDM: She had an early 1h GTT which was abnormal and the 3hr was normal.   3) History of FGR and delivered between 36-37 weeks  Vitals with BMI 12/24/2020 07/12/2019 07/08/2019  Height 4\' 10"  4\' 10"  -  Weight 127 lbs 120 lbs 3 oz -  BMI 26.55 25.13 -  Systolic 126 133  Diastolic 79 90 92  Pulse 104 102 -   Past Medical History:  Diagnosis Date   Gestational diabetes    Placenta previa    Sickle cell trait (HCC)    OB History  Gravida Para Term Preterm AB Living  3 2 1 1  0 2  SAB IAB Ectopic Multiple Live Births  0 0 0 0 2    # Outcome Date GA Lbr Len/2nd Weight Sex Delivery Anes PTL Lv  3 Current           2 Term 05/18/19 [redacted]w[redacted]d / 00:09 2480 g F Vag-Spont None  LIV     Name:     Apgar1: 8  Apgar5: 9  1 Preterm 08/30/08 [redacted]w[redacted]d  1814 g F Vag-Spont  N LIV     Complications: Placenta Previa     Name: Brenda Rogers    No past surgical history on file. Social History   Socioeconomic History   Marital status: Married    Spouse name: Not on file   Number of children: Not on file   Years of education: Not on file   Highest education level: Not on file  Occupational History   Not on file  Tobacco Use   Smoking status: Never   Smokeless tobacco: Never  Vaping Use   Vaping Use: Never used  Substance and Sexual Activity   Alcohol use: Never    Drug use: Never   Sexual activity: Not Currently    Birth control/protection: None  Other Topics Concern   Not on file  Social History Narrative   Not on file   Social Determinants of Health   Financial Resource Strain: Not on file  Food Insecurity: Not on file  Transportation Needs: Not on file  Physical Activity: Not on file  Stress: Not on file  Social Connections: Not on file  Intimate Partner Violence: Not on file     Current Outpatient Medications (Cardiovascular):    NIFEdipine (PROCARDIA XL) 30 MG 24 hr tablet, Take 1 tablet (30 mg total) by mouth 2 (two) times daily. (Patient not taking: Reported on 12/24/2020)   Current Outpatient Medications (Analgesics):    aspirin 81 MG chewable tablet, Chew 81 mg by mouth daily.   Current Outpatient Medications (Other):    Prenatal Vit-Fe Fumarate-FA (MULTIVITAMIN-PRENATAL) 27-0.8 MG TABS tablet, Take 1 tablet by mouth daily at 12 noon.  Family History  Problem Relation Age of Onset   Breast cancer Neg Hx  Ovarian cancer Neg Hx    Colon cancer Neg Hx     Imaging today:  A single intrauterine pregnancy with measurements consistent with dates, however the EFW was at the 11th% which is small for gestational age.  The fetal anatomy was normal today. There was suboptimal views of the fetal anatomy observed secondary to fetal position.   Impression/Counseling:  I discussed with Brenda Rogers her diagnosis of chronic hypertension. We discussed that the diagnosis is made by having an elevated blood pressure > 140/90 prior to 20 weeks.   We discussed the complications related to chronic hypertension include fetal growth restriction, preeclampsia, placental abruption, maternal stroke and stillbirth.  The main stay of management includes maintaining blood pressure of < 150/105 but >120/70 mmHg. If her blood pressure exceed 150/105 persistently consider increasing therapy to 60 mg daily.  Brenda Rogers has normal baseline preeclampsia  labs and has a normal EKG per provider reports. She is also taking daily low dose ASA.  Lastly, I recommend serial growth exams every 4 weeks and initiate weekly testing at 32 weeks. If she remains stable delivery can be accomplished by 39 weeks, however, if her blood pressure is challenging to manage consider delivery at 37 weeks.  GDM: Repeat GTT at 26-28 weeks  H/O FGR: continue serial growth as noted above.   I spent 30 minutes with >50% in face to face consultation.  Brenda Olive, MD

## 2021-01-15 ENCOUNTER — Other Ambulatory Visit: Payer: Self-pay | Admitting: Maternal & Fetal Medicine

## 2021-01-15 DIAGNOSIS — O09292 Supervision of pregnancy with other poor reproductive or obstetric history, second trimester: Secondary | ICD-10-CM

## 2021-01-15 DIAGNOSIS — O09899 Supervision of other high risk pregnancies, unspecified trimester: Secondary | ICD-10-CM

## 2021-01-15 DIAGNOSIS — Z3A26 26 weeks gestation of pregnancy: Secondary | ICD-10-CM

## 2021-01-15 DIAGNOSIS — O10919 Unspecified pre-existing hypertension complicating pregnancy, unspecified trimester: Secondary | ICD-10-CM

## 2021-01-15 DIAGNOSIS — Z8759 Personal history of other complications of pregnancy, childbirth and the puerperium: Secondary | ICD-10-CM

## 2021-01-19 ENCOUNTER — Ambulatory Visit (HOSPITAL_BASED_OUTPATIENT_CLINIC_OR_DEPARTMENT_OTHER): Payer: BC Managed Care – PPO | Admitting: Obstetrics

## 2021-01-19 ENCOUNTER — Other Ambulatory Visit: Payer: Self-pay

## 2021-01-19 ENCOUNTER — Ambulatory Visit: Payer: BC Managed Care – PPO | Attending: Obstetrics

## 2021-01-19 ENCOUNTER — Other Ambulatory Visit: Payer: Self-pay | Admitting: Obstetrics

## 2021-01-19 VITALS — BP 121/78 | HR 92 | Temp 98.4°F | Ht <= 58 in | Wt 133.0 lb

## 2021-01-19 DIAGNOSIS — O365922 Maternal care for other known or suspected poor fetal growth, second trimester, fetus 2: Secondary | ICD-10-CM

## 2021-01-19 DIAGNOSIS — O365921 Maternal care for other known or suspected poor fetal growth, second trimester, fetus 1: Secondary | ICD-10-CM

## 2021-01-19 DIAGNOSIS — O10912 Unspecified pre-existing hypertension complicating pregnancy, second trimester: Secondary | ICD-10-CM

## 2021-01-19 DIAGNOSIS — Z8759 Personal history of other complications of pregnancy, childbirth and the puerperium: Secondary | ICD-10-CM

## 2021-01-19 DIAGNOSIS — O09292 Supervision of pregnancy with other poor reproductive or obstetric history, second trimester: Secondary | ICD-10-CM | POA: Diagnosis not present

## 2021-01-19 DIAGNOSIS — Z8632 Personal history of gestational diabetes: Secondary | ICD-10-CM | POA: Diagnosis not present

## 2021-01-19 DIAGNOSIS — O365920 Maternal care for other known or suspected poor fetal growth, second trimester, not applicable or unspecified: Secondary | ICD-10-CM | POA: Diagnosis not present

## 2021-01-19 DIAGNOSIS — O09892 Supervision of other high risk pregnancies, second trimester: Secondary | ICD-10-CM | POA: Diagnosis not present

## 2021-01-19 DIAGNOSIS — Z3A26 26 weeks gestation of pregnancy: Secondary | ICD-10-CM

## 2021-01-19 DIAGNOSIS — O09899 Supervision of other high risk pregnancies, unspecified trimester: Secondary | ICD-10-CM

## 2021-01-19 DIAGNOSIS — O0992 Supervision of high risk pregnancy, unspecified, second trimester: Secondary | ICD-10-CM

## 2021-01-19 DIAGNOSIS — O10919 Unspecified pre-existing hypertension complicating pregnancy, unspecified trimester: Secondary | ICD-10-CM

## 2021-01-19 NOTE — Progress Notes (Signed)
MFM Note  Brenda Rogers was seen for a follow up exam due to history of chronic hypertension.  She reports that she was treated with Procardia earlier in her pregnancy.  However, the Procardia was discontinued as her blood pressures have been within normal limits.  Her prior two pregnancies were complicated by fetal growth restriction.  She reports that her first child was delivered at 36 weeks weighing 4 pounds and her second child was delivered at 37 weeks weighing 5 pounds.   She denies any problems since her last exam and reports feeling vigorous fetal movements throughout the day.    On today's exam, the EFW measures at the 7th percentile for her gestational age indicating fetal growth restriction.  There was normal amniotic fluid noted.    Fetal movements were noted throughout today's ultrasound exam.  Doppler studies of the umbilical arteries showed a normal S/D ratio of 3.61.  There were no signs of absent or reversed end-diastolic flow.    The implications and management of fetal growth restriction was discussed with the patient.  She was advised regarding the increased risk of an adverse pregnancy outcome such as stillbirth associated with IUGR, especially if the umbilical artery Doppler studies are abnormal.    She was advised that fetal growth restriction is a common finding and that given her past history of IUGR fetuses, it is not surprising that she has another IUGR fetus again in her current pregnancy.  She was reassured that most cases of IUGR result in the delivery of a healthy infant close to term.  Due to fetal growth restriction, we will continue to follow her with frequent umbilical artery Doppler studies and will start weekly fetal testing at around 28 weeks.    Another umbilical artery Doppler study was scheduled in 2 weeks.  We will reassess the fetal growth in 3 weeks.  She understands that due to IUGR, an earlier delivery usually at between 37 to 38 weeks may be  recommended.  She stated that all of her questions have been answered to her complete satisfaction.    All conversations were held with the patient today with the help of a Spanish interpreter.  A total of 20 minutes was spent counseling and coordinating the care for this patient.  Greater than 50% of the time was spent in direct face-to-face contact.    Recommendations:   Repeat umbilical artery Doppler studies in 2 weeks and growth scan in 3 weeks Start weekly fetal testing at 28 weeks

## 2021-02-02 ENCOUNTER — Other Ambulatory Visit: Payer: Self-pay | Admitting: Obstetrics

## 2021-02-02 DIAGNOSIS — IMO0002 Reserved for concepts with insufficient information to code with codable children: Secondary | ICD-10-CM

## 2021-02-02 DIAGNOSIS — O365930 Maternal care for other known or suspected poor fetal growth, third trimester, not applicable or unspecified: Secondary | ICD-10-CM

## 2021-02-04 ENCOUNTER — Other Ambulatory Visit: Payer: Self-pay | Admitting: Obstetrics

## 2021-02-04 ENCOUNTER — Other Ambulatory Visit: Payer: Self-pay

## 2021-02-04 ENCOUNTER — Ambulatory Visit: Payer: BC Managed Care – PPO | Attending: Obstetrics

## 2021-02-04 DIAGNOSIS — Z3A29 29 weeks gestation of pregnancy: Secondary | ICD-10-CM | POA: Diagnosis not present

## 2021-02-04 DIAGNOSIS — O99013 Anemia complicating pregnancy, third trimester: Secondary | ICD-10-CM | POA: Diagnosis not present

## 2021-02-04 DIAGNOSIS — O09899 Supervision of other high risk pregnancies, unspecified trimester: Secondary | ICD-10-CM

## 2021-02-04 DIAGNOSIS — O10913 Unspecified pre-existing hypertension complicating pregnancy, third trimester: Secondary | ICD-10-CM | POA: Diagnosis not present

## 2021-02-04 DIAGNOSIS — O365930 Maternal care for other known or suspected poor fetal growth, third trimester, not applicable or unspecified: Secondary | ICD-10-CM

## 2021-02-04 DIAGNOSIS — O10013 Pre-existing essential hypertension complicating pregnancy, third trimester: Secondary | ICD-10-CM

## 2021-02-04 DIAGNOSIS — Z8759 Personal history of other complications of pregnancy, childbirth and the puerperium: Secondary | ICD-10-CM

## 2021-02-04 DIAGNOSIS — IMO0002 Reserved for concepts with insufficient information to code with codable children: Secondary | ICD-10-CM

## 2021-02-04 DIAGNOSIS — O09293 Supervision of pregnancy with other poor reproductive or obstetric history, third trimester: Secondary | ICD-10-CM | POA: Insufficient documentation

## 2021-02-09 ENCOUNTER — Other Ambulatory Visit: Payer: Self-pay

## 2021-02-09 ENCOUNTER — Ambulatory Visit: Payer: BC Managed Care – PPO | Attending: Maternal & Fetal Medicine

## 2021-02-09 DIAGNOSIS — O09899 Supervision of other high risk pregnancies, unspecified trimester: Secondary | ICD-10-CM

## 2021-02-09 DIAGNOSIS — O365930 Maternal care for other known or suspected poor fetal growth, third trimester, not applicable or unspecified: Secondary | ICD-10-CM | POA: Diagnosis present

## 2021-02-09 DIAGNOSIS — O09293 Supervision of pregnancy with other poor reproductive or obstetric history, third trimester: Secondary | ICD-10-CM

## 2021-02-09 DIAGNOSIS — O10013 Pre-existing essential hypertension complicating pregnancy, third trimester: Secondary | ICD-10-CM

## 2021-02-09 DIAGNOSIS — Z3A29 29 weeks gestation of pregnancy: Secondary | ICD-10-CM | POA: Diagnosis not present

## 2021-02-09 DIAGNOSIS — Z8759 Personal history of other complications of pregnancy, childbirth and the puerperium: Secondary | ICD-10-CM

## 2021-02-09 DIAGNOSIS — O09893 Supervision of other high risk pregnancies, third trimester: Secondary | ICD-10-CM | POA: Diagnosis not present

## 2021-02-09 DIAGNOSIS — O09213 Supervision of pregnancy with history of pre-term labor, third trimester: Secondary | ICD-10-CM | POA: Insufficient documentation

## 2021-02-09 DIAGNOSIS — O10913 Unspecified pre-existing hypertension complicating pregnancy, third trimester: Secondary | ICD-10-CM | POA: Insufficient documentation

## 2021-02-18 ENCOUNTER — Other Ambulatory Visit: Payer: Self-pay

## 2021-02-18 DIAGNOSIS — O365921 Maternal care for other known or suspected poor fetal growth, second trimester, fetus 1: Secondary | ICD-10-CM

## 2021-02-23 ENCOUNTER — Other Ambulatory Visit: Payer: Self-pay | Admitting: Maternal & Fetal Medicine

## 2021-02-23 ENCOUNTER — Other Ambulatory Visit: Payer: Self-pay

## 2021-02-23 ENCOUNTER — Ambulatory Visit: Payer: BC Managed Care – PPO | Attending: Maternal & Fetal Medicine

## 2021-02-23 DIAGNOSIS — O10013 Pre-existing essential hypertension complicating pregnancy, third trimester: Secondary | ICD-10-CM

## 2021-02-23 DIAGNOSIS — O10913 Unspecified pre-existing hypertension complicating pregnancy, third trimester: Secondary | ICD-10-CM | POA: Insufficient documentation

## 2021-02-23 DIAGNOSIS — O24410 Gestational diabetes mellitus in pregnancy, diet controlled: Secondary | ICD-10-CM | POA: Insufficient documentation

## 2021-02-23 DIAGNOSIS — O09293 Supervision of pregnancy with other poor reproductive or obstetric history, third trimester: Secondary | ICD-10-CM | POA: Insufficient documentation

## 2021-02-23 DIAGNOSIS — Z3A31 31 weeks gestation of pregnancy: Secondary | ICD-10-CM | POA: Diagnosis not present

## 2021-02-23 DIAGNOSIS — O365930 Maternal care for other known or suspected poor fetal growth, third trimester, not applicable or unspecified: Secondary | ICD-10-CM

## 2021-02-23 DIAGNOSIS — O365931 Maternal care for other known or suspected poor fetal growth, third trimester, fetus 1: Secondary | ICD-10-CM | POA: Insufficient documentation

## 2021-02-23 DIAGNOSIS — O365921 Maternal care for other known or suspected poor fetal growth, second trimester, fetus 1: Secondary | ICD-10-CM

## 2021-02-23 DIAGNOSIS — O09213 Supervision of pregnancy with history of pre-term labor, third trimester: Secondary | ICD-10-CM | POA: Insufficient documentation

## 2021-02-25 ENCOUNTER — Encounter: Payer: BC Managed Care – PPO | Attending: Certified Nurse Midwife | Admitting: *Deleted

## 2021-02-25 ENCOUNTER — Other Ambulatory Visit: Payer: Self-pay

## 2021-02-25 ENCOUNTER — Encounter: Payer: Self-pay | Admitting: *Deleted

## 2021-02-25 VITALS — BP 120/66 | Ht <= 58 in | Wt 135.4 lb

## 2021-02-25 DIAGNOSIS — O10913 Unspecified pre-existing hypertension complicating pregnancy, third trimester: Secondary | ICD-10-CM

## 2021-02-25 DIAGNOSIS — O24410 Gestational diabetes mellitus in pregnancy, diet controlled: Secondary | ICD-10-CM | POA: Diagnosis not present

## 2021-02-25 DIAGNOSIS — Z3A00 Weeks of gestation of pregnancy not specified: Secondary | ICD-10-CM | POA: Insufficient documentation

## 2021-02-25 DIAGNOSIS — O365921 Maternal care for other known or suspected poor fetal growth, second trimester, fetus 1: Secondary | ICD-10-CM

## 2021-02-25 NOTE — Patient Instructions (Addendum)
Read booklet on Gestational Diabetes Follow Gestational Meal Planning Guidelines Avoid fruit juices Allow 2-3 hours between meals and snacks Check blood sugars 4 x day - before breakfast and 2 hrs after every meal and record  Bring blood sugar log to all appointments Call MD for prescription for meter strips and lancets (one that you have at home and used last time) Strips   Accu-Chek Guide  Lancets   Accu-Chek Fastclix Call your new  insurance plan to see which glucometer is covered and call back if our office needs to furnish another one Purchase urine ketone strips if instructed by MD and check urine ketones every am:  If + increase bedtime snack to 1 protein and 2 carbohydrate servings Walk 20-30 minutes at least 5 x week if permitted by MD

## 2021-02-25 NOTE — Progress Notes (Signed)
Diabetes Self-Management Education  Visit Type: First/Initial  Appt. Start Time: 1505 Appt. End Time: 1615  02/25/2021  [name], identified by name and date of birth, is a 35 y.o. female with a diagnosis of Diabetes: Gestational Diabetes.   ASSESSMENT  Blood pressure 120/66, height 4\' 10"  (1.473 m), weight 135 lb 6.4 oz (61.4 kg), .last menstrual period 07/16/2020, estimated date of delivery 04/22/2021 Body mass index is 28.3 kg/m.   Diabetes Self-Management Education - 02/25/21 1637       Visit Information   Visit Type First/Initial      Initial Visit   Diabetes Type Gestational Diabetes    Are you currently following a meal plan? No    Are you taking your medications as prescribed? Yes    Date Diagnosed last week      Health Coping   How would you rate your overall health? Good      Psychosocial Assessment   Patient Belief/Attitude about Diabetes Motivated to manage diabetes    Self-care barriers None    Self-management support Doctor's office;Family    Patient Concerns Nutrition/Meal planning;Glycemic Control;Monitoring;Healthy Lifestyle    Special Needs None    Preferred Learning Style Hands on;Visual    Learning Readiness Change in progress    How often do you need to have someone help you when you read instructions, pamphlets, or other written materials from your doctor or pharmacy? 1 - Never    What is the last grade level you completed in school? some college      Pre-Education Assessment   Patient understands the diabetes disease and treatment process. Needs Review    Patient understands incorporating nutritional management into lifestyle. Needs Review    Patient undertands incorporating physical activity into lifestyle. Needs Instruction    Patient understands using medications safely. Needs Instruction    Patient understands monitoring blood glucose, interpreting and using results Needs Review    Patient understands prevention, detection, and  treatment of acute complications. Needs Instruction    Patient understands prevention, detection, and treatment of chronic complications. Needs Review    Patient understands how to develop strategies to address psychosocial issues. Needs Review    Patient understands how to develop strategies to promote health/change behavior. Needs Review      Complications   Last HgB A1C per patient/outside source 5.1 %   10/09/2020   How often do you check your blood sugar? 0 times/day (not testing)   She has a meter but requires new prescription for strips and lancets. BG in the office was 89 mg/dL at 12/09/2020 pm - 2 hrs pp.   Have you had a dilated eye exam in the past 12 months? No    Have you had a dental exam in the past 12 months? No    Are you checking your feet? No      Dietary Intake   Breakfast cheese and bread; egg and cheese croissant; 4:27 or chicken sandwich with lettuce, tomato    Lunch sandwich or left-overs from supper    Snack (afternoon) cheese and crackers; occasional fruit (strawberries, bananas, oranges)    Dinner chicken, beef, pork; potatoes, peas, beans, corn, rice, pasta, lettuce, carrots, broccoli, cuccumbers, tomatoes, peppers, onions, green beans    Beverage(s) water, occasional juice      Exercise   Exercise Type ADL's      Patient Education   Previous Diabetes Education Yes (please comment)   2 years ago here - previous GDM   Disease  state  Definition of diabetes, type 1 and 2, and the diagnosis of diabetes;Factors that contribute to the development of diabetes    Nutrition management  Role of diet in the treatment of diabetes and the relationship between the three main macronutrients and blood glucose level;Food label reading, portion sizes and measuring food.;Carbohydrate counting;Reviewed blood glucose goals for pre and post meals and how to evaluate the patients' food intake on their blood glucose level.    Physical activity and exercise  Role of exercise on diabetes  management, blood pressure control and cardiac health.    Medications Other (comment)   Limited use of oral medications during pregnancy and potential for insulin.   Monitoring Purpose and frequency of SMBG.;Taught/discussed recording of test results and interpretation of SMBG.;Identified appropriate SMBG and/or A1C goals.;Ketone testing, when, how.    Chronic complications Relationship between chronic complications and blood glucose control    Psychosocial adjustment Identified and addressed patients feelings and concerns about diabetes    Preconception care Pregnancy and GDM  Role of pre-pregnancy blood glucose control on the development of the fetus;Reviewed with patient blood glucose goals with pregnancy;Role of family planning for patients with diabetes      Individualized Goals (developed by patient)   Reducing Risk Other (comment)   improve blood sugars, prevent diabetes complications, lead a healthier lifestyle     Post-Education Assessment   Patient understands the diabetes disease and treatment process. Demonstrates understanding / competency    Patient understands incorporating nutritional management into lifestyle. Needs Review    Patient undertands incorporating physical activity into lifestyle. Needs Review    Patient understands using medications safely. Demonstrates understanding / competency    Patient understands monitoring blood glucose, interpreting and using results Demonstrates understanding / competency    Patient understands prevention, detection, and treatment of chronic complications. Demonstrates understanding / competency    Patient understands how to develop strategies to address psychosocial issues. Demonstrates understanding / competency    Patient understands how to develop strategies to promote health/change behavior. Demonstrates understanding / competency      Outcomes   Expected Outcomes Demonstrated interest in learning. Expect positive outcomes    Future  DMSE PRN    Program Status Completed        Individualized Plan for Diabetes Self-Management Training:   Learning Objective:  Patient will have a greater understanding of diabetes self-management. Patient education plan is to attend individual and/or group sessions per assessed needs and concerns.   Plan:   Patient Instructions  Read booklet on Gestational Diabetes Follow Gestational Meal Planning Guidelines Avoid fruit juices Allow 2-3 hours between meals and snacks Check blood sugars 4 x day - before breakfast and 2 hrs after every meal and record  Bring blood sugar log to all appointments Call MD for prescription for meter strips and lancets (one that you have at home and used last time) Strips   Accu-Chek Guide  Lancets   Accu-Chek Fast Clix Call your new  insurance plan to see which glucometer is covered and call back if our office needs to furnish another one Purchase urine ketone strips if instructed by MD and check urine ketones every am:  If + increase bedtime snack to 1 protein and 2 carbohydrate servings Walk 20-30 minutes at least 5 x week if permitted by MD  Expected Outcomes:  Demonstrated interest in learning. Expect positive outcomes  Education material provided:  Gestational Booklet Gestational Meal Planning Guidelines Simple Meal Plan Goals for a Healthy Pregnancy  If problems or questions, patient to contact team via:   Sharion Settler, RN, CCM, CDCES [phone]  Future DSME appointment: PRN She just had education 2 years ago and was able to recall most of the information. She will call back if she needs to see the dietitian.

## 2021-03-02 ENCOUNTER — Other Ambulatory Visit: Payer: Self-pay | Admitting: Maternal & Fetal Medicine

## 2021-03-02 ENCOUNTER — Ambulatory Visit: Payer: BC Managed Care – PPO | Attending: Maternal & Fetal Medicine

## 2021-03-02 ENCOUNTER — Other Ambulatory Visit: Payer: Self-pay

## 2021-03-02 DIAGNOSIS — O10013 Pre-existing essential hypertension complicating pregnancy, third trimester: Secondary | ICD-10-CM | POA: Diagnosis not present

## 2021-03-02 DIAGNOSIS — O365930 Maternal care for other known or suspected poor fetal growth, third trimester, not applicable or unspecified: Secondary | ICD-10-CM | POA: Diagnosis not present

## 2021-03-02 DIAGNOSIS — O99213 Obesity complicating pregnancy, third trimester: Secondary | ICD-10-CM

## 2021-03-02 DIAGNOSIS — Z3A32 32 weeks gestation of pregnancy: Secondary | ICD-10-CM

## 2021-03-02 DIAGNOSIS — E669 Obesity, unspecified: Secondary | ICD-10-CM | POA: Diagnosis not present

## 2021-03-02 DIAGNOSIS — O10913 Unspecified pre-existing hypertension complicating pregnancy, third trimester: Secondary | ICD-10-CM

## 2021-03-05 ENCOUNTER — Other Ambulatory Visit: Payer: Self-pay | Admitting: Maternal & Fetal Medicine

## 2021-03-05 DIAGNOSIS — O09899 Supervision of other high risk pregnancies, unspecified trimester: Secondary | ICD-10-CM

## 2021-03-05 DIAGNOSIS — O365930 Maternal care for other known or suspected poor fetal growth, third trimester, not applicable or unspecified: Secondary | ICD-10-CM

## 2021-03-05 DIAGNOSIS — Z8759 Personal history of other complications of pregnancy, childbirth and the puerperium: Secondary | ICD-10-CM

## 2021-03-09 ENCOUNTER — Other Ambulatory Visit: Payer: Self-pay

## 2021-03-11 ENCOUNTER — Ambulatory Visit: Payer: BC Managed Care – PPO | Attending: Obstetrics

## 2021-03-11 ENCOUNTER — Other Ambulatory Visit: Payer: Self-pay

## 2021-03-11 ENCOUNTER — Ambulatory Visit: Payer: BC Managed Care – PPO

## 2021-03-11 DIAGNOSIS — O24419 Gestational diabetes mellitus in pregnancy, unspecified control: Secondary | ICD-10-CM | POA: Insufficient documentation

## 2021-03-11 DIAGNOSIS — O09893 Supervision of other high risk pregnancies, third trimester: Secondary | ICD-10-CM | POA: Diagnosis not present

## 2021-03-11 DIAGNOSIS — O09293 Supervision of pregnancy with other poor reproductive or obstetric history, third trimester: Secondary | ICD-10-CM | POA: Diagnosis not present

## 2021-03-11 DIAGNOSIS — O10913 Unspecified pre-existing hypertension complicating pregnancy, third trimester: Secondary | ICD-10-CM | POA: Insufficient documentation

## 2021-03-11 DIAGNOSIS — O09899 Supervision of other high risk pregnancies, unspecified trimester: Secondary | ICD-10-CM

## 2021-03-11 DIAGNOSIS — Z8759 Personal history of other complications of pregnancy, childbirth and the puerperium: Secondary | ICD-10-CM

## 2021-03-11 DIAGNOSIS — O365930 Maternal care for other known or suspected poor fetal growth, third trimester, not applicable or unspecified: Secondary | ICD-10-CM | POA: Diagnosis not present

## 2021-03-11 DIAGNOSIS — O09213 Supervision of pregnancy with history of pre-term labor, third trimester: Secondary | ICD-10-CM | POA: Insufficient documentation

## 2021-03-11 DIAGNOSIS — Z3A34 34 weeks gestation of pregnancy: Secondary | ICD-10-CM | POA: Insufficient documentation

## 2021-03-16 ENCOUNTER — Other Ambulatory Visit: Payer: Self-pay

## 2021-03-16 ENCOUNTER — Other Ambulatory Visit: Payer: Self-pay | Admitting: Obstetrics

## 2021-03-16 DIAGNOSIS — O24410 Gestational diabetes mellitus in pregnancy, diet controlled: Secondary | ICD-10-CM

## 2021-03-16 DIAGNOSIS — Z8759 Personal history of other complications of pregnancy, childbirth and the puerperium: Secondary | ICD-10-CM

## 2021-03-16 DIAGNOSIS — O365930 Maternal care for other known or suspected poor fetal growth, third trimester, not applicable or unspecified: Secondary | ICD-10-CM

## 2021-03-16 DIAGNOSIS — O09899 Supervision of other high risk pregnancies, unspecified trimester: Secondary | ICD-10-CM

## 2021-03-16 DIAGNOSIS — O10919 Unspecified pre-existing hypertension complicating pregnancy, unspecified trimester: Secondary | ICD-10-CM

## 2021-03-18 ENCOUNTER — Other Ambulatory Visit: Payer: Self-pay | Admitting: Obstetrics

## 2021-03-18 ENCOUNTER — Other Ambulatory Visit: Payer: Self-pay

## 2021-03-18 ENCOUNTER — Ambulatory Visit: Payer: BC Managed Care – PPO | Attending: Obstetrics and Gynecology

## 2021-03-18 DIAGNOSIS — O09293 Supervision of pregnancy with other poor reproductive or obstetric history, third trimester: Secondary | ICD-10-CM | POA: Diagnosis not present

## 2021-03-18 DIAGNOSIS — O24410 Gestational diabetes mellitus in pregnancy, diet controlled: Secondary | ICD-10-CM

## 2021-03-18 DIAGNOSIS — Z8759 Personal history of other complications of pregnancy, childbirth and the puerperium: Secondary | ICD-10-CM

## 2021-03-18 DIAGNOSIS — O365930 Maternal care for other known or suspected poor fetal growth, third trimester, not applicable or unspecified: Secondary | ICD-10-CM | POA: Diagnosis present

## 2021-03-18 DIAGNOSIS — O10919 Unspecified pre-existing hypertension complicating pregnancy, unspecified trimester: Secondary | ICD-10-CM

## 2021-03-18 DIAGNOSIS — O10013 Pre-existing essential hypertension complicating pregnancy, third trimester: Secondary | ICD-10-CM | POA: Diagnosis not present

## 2021-03-18 DIAGNOSIS — O09893 Supervision of other high risk pregnancies, third trimester: Secondary | ICD-10-CM

## 2021-03-18 DIAGNOSIS — Z3A35 35 weeks gestation of pregnancy: Secondary | ICD-10-CM | POA: Diagnosis not present

## 2021-03-18 DIAGNOSIS — O10913 Unspecified pre-existing hypertension complicating pregnancy, third trimester: Secondary | ICD-10-CM | POA: Diagnosis not present

## 2021-03-18 DIAGNOSIS — O09899 Supervision of other high risk pregnancies, unspecified trimester: Secondary | ICD-10-CM

## 2021-03-19 ENCOUNTER — Other Ambulatory Visit: Payer: Self-pay | Admitting: Obstetrics and Gynecology

## 2021-03-19 DIAGNOSIS — O10919 Unspecified pre-existing hypertension complicating pregnancy, unspecified trimester: Secondary | ICD-10-CM

## 2021-03-19 DIAGNOSIS — O365930 Maternal care for other known or suspected poor fetal growth, third trimester, not applicable or unspecified: Secondary | ICD-10-CM

## 2021-03-19 DIAGNOSIS — O24410 Gestational diabetes mellitus in pregnancy, diet controlled: Secondary | ICD-10-CM

## 2021-03-22 ENCOUNTER — Other Ambulatory Visit: Payer: Self-pay

## 2021-03-22 ENCOUNTER — Observation Stay
Admission: EM | Admit: 2021-03-22 | Discharge: 2021-03-22 | Disposition: A | Discharge status: Home / Self Care | Payer: BC Managed Care – PPO | Attending: Obstetrics and Gynecology | Admitting: Obstetrics and Gynecology

## 2021-03-22 ENCOUNTER — Encounter: Payer: Self-pay | Admitting: Obstetrics and Gynecology

## 2021-03-22 DIAGNOSIS — Z3A35 35 weeks gestation of pregnancy: Secondary | ICD-10-CM | POA: Diagnosis not present

## 2021-03-22 DIAGNOSIS — O4703 False labor before 37 completed weeks of gestation, third trimester: Secondary | ICD-10-CM | POA: Diagnosis not present

## 2021-03-22 NOTE — Progress Notes (Signed)
Pt discharged home per F.Dickerson,CNM order.  NST reactive and appropriate for gestational age. CNM reviewed fetal tracing. Pt stable and ambulatory and an After Visit Summary was printed and given to the patient. Discharge education completed with patient/family including follow up instructions, appointments, and medication list. Pt received labor and bleeding precautions. Patient able to verbalize understanding, all questions fully answered upon discharge. Patient instructed to return to ED, call 911, or call MD for any changes in condition. Pt discharged home via personal vehicle with support person.

## 2021-03-22 NOTE — OB Triage Note (Signed)
Pt Brenda Rogers 35 y.o. presents to L&D after being sent from [facility] obgyn for monitoring. Pt states she had an NST performed at the office and was told she is contracting. Pt is a G3P1102 at [redacted]w[redacted]d . Pt denies signs and symptons consistent with rupture of membranes or active vaginal bleeding. Pt denies contractions and states positive fetal movement. Pt does report feel occasional cramping and rates pain 1/10 with "lighting crotch" pains with activity. External FM and TOCO applied to non-tender abdomen and assessing. Initial FHR 150. Vital signs obtained and within normal limits. Provider notified of pt.

## 2021-03-22 NOTE — Discharge Summary (Signed)
Janyce is a [id number] at 35w 4d with an EDD of Estimated Date of Delivery: 04/22/21.  She presents to L&D for antenatal NST for for non-reactive during routine prenatal office visit.  S: resting in bed comfortably  O: BP 128/74 (BP Location: Right Arm)   Pulse 86   Temp 98.5 F (36.9 C) (Oral)   Resp 18   Ht 4\' 10"  (1.473 m)   Wt 61.2 kg   LMP  (LMP Unknown)   BMI 28.22 kg/m    NST: Baseline: 145bpm Variability: moderate Accels: >2 15x15 Decels: none Toco: intermittent irritability Cat: I  A/P NST today reactive Continue routine antepartum care.   , CNM Certified Nurse Midwife [facility]

## 2021-03-23 ENCOUNTER — Other Ambulatory Visit: Payer: Self-pay | Admitting: Certified Nurse Midwife

## 2021-03-23 ENCOUNTER — Other Ambulatory Visit: Payer: Self-pay

## 2021-03-23 NOTE — Progress Notes (Signed)
[id number] at [redacted]w[redacted]d, LMP of 07/16/2020, c/w [place] at [redacted]w[redacted]d.  Scheduled for induction of labor for IUGR on 04/10/2021 @ 0800.   Prenatal provider: [facility] Pregnancy complicated by: 1. IUGR 2. Chronic HTN  3. Gestational Diabetes 4. H/o Preterm Delivery at [redacted]w[redacted]d  5. Sickle cell Trait  6. Anxiety  Prenatal Labs: Blood type/Rh B+  Antibody screen neg  Rubella Immune  Varicella Immune  RPR NR  HBsAg Neg  HIV NR  GC neg  Chlamydia neg  Genetic screening cfDNA negative  1 hour GTT 150  3 hour GTT 91, 190, 177, 111  GBS To be collected at 36wks   Tdap: declined Flu: declined Contraception: vasectomy Feeding preference: breast  ____ Janyce Llanos, CNM  Certified Nurse Midwife [facility]  Clinic OB[facility]

## 2021-03-25 ENCOUNTER — Ambulatory Visit: Payer: BC Managed Care – PPO | Attending: Maternal & Fetal Medicine

## 2021-03-25 ENCOUNTER — Other Ambulatory Visit: Payer: Self-pay

## 2021-03-25 DIAGNOSIS — O10013 Pre-existing essential hypertension complicating pregnancy, third trimester: Secondary | ICD-10-CM | POA: Diagnosis not present

## 2021-03-25 DIAGNOSIS — Z3A36 36 weeks gestation of pregnancy: Secondary | ICD-10-CM

## 2021-03-25 DIAGNOSIS — O365930 Maternal care for other known or suspected poor fetal growth, third trimester, not applicable or unspecified: Secondary | ICD-10-CM

## 2021-03-25 DIAGNOSIS — O24410 Gestational diabetes mellitus in pregnancy, diet controlled: Secondary | ICD-10-CM | POA: Insufficient documentation

## 2021-03-25 DIAGNOSIS — O09293 Supervision of pregnancy with other poor reproductive or obstetric history, third trimester: Secondary | ICD-10-CM | POA: Diagnosis not present

## 2021-03-25 DIAGNOSIS — O10919 Unspecified pre-existing hypertension complicating pregnancy, unspecified trimester: Secondary | ICD-10-CM

## 2021-03-29 LAB — OB RESULTS CONSOLE RPR: RPR: NONREACTIVE

## 2021-03-29 LAB — OB RESULTS CONSOLE GC/CHLAMYDIA
Chlamydia: NEGATIVE
Gonorrhea: NEGATIVE

## 2021-03-29 LAB — OB RESULTS CONSOLE HIV ANTIBODY (ROUTINE TESTING): HIV: NONREACTIVE

## 2021-03-29 LAB — OB RESULTS CONSOLE GBS: GBS: POSITIVE

## 2021-03-30 ENCOUNTER — Other Ambulatory Visit: Payer: Self-pay

## 2021-03-30 DIAGNOSIS — O10913 Unspecified pre-existing hypertension complicating pregnancy, third trimester: Secondary | ICD-10-CM

## 2021-03-30 DIAGNOSIS — O365931 Maternal care for other known or suspected poor fetal growth, third trimester, fetus 1: Secondary | ICD-10-CM

## 2021-04-01 ENCOUNTER — Ambulatory Visit: Payer: BC Managed Care – PPO | Attending: Maternal & Fetal Medicine

## 2021-04-01 ENCOUNTER — Other Ambulatory Visit: Payer: Self-pay

## 2021-04-01 VITALS — BP 105/82 | HR 86 | Temp 98.1°F | Ht <= 58 in | Wt 136.5 lb

## 2021-04-01 DIAGNOSIS — O09213 Supervision of pregnancy with history of pre-term labor, third trimester: Secondary | ICD-10-CM

## 2021-04-01 DIAGNOSIS — Z3A37 37 weeks gestation of pregnancy: Secondary | ICD-10-CM | POA: Diagnosis not present

## 2021-04-01 DIAGNOSIS — O10913 Unspecified pre-existing hypertension complicating pregnancy, third trimester: Secondary | ICD-10-CM | POA: Insufficient documentation

## 2021-04-01 DIAGNOSIS — O24410 Gestational diabetes mellitus in pregnancy, diet controlled: Secondary | ICD-10-CM | POA: Insufficient documentation

## 2021-04-01 DIAGNOSIS — O10013 Pre-existing essential hypertension complicating pregnancy, third trimester: Secondary | ICD-10-CM | POA: Diagnosis not present

## 2021-04-01 DIAGNOSIS — Z8759 Personal history of other complications of pregnancy, childbirth and the puerperium: Secondary | ICD-10-CM | POA: Diagnosis not present

## 2021-04-01 DIAGNOSIS — O365931 Maternal care for other known or suspected poor fetal growth, third trimester, fetus 1: Secondary | ICD-10-CM | POA: Insufficient documentation

## 2021-04-01 DIAGNOSIS — O09293 Supervision of pregnancy with other poor reproductive or obstetric history, third trimester: Secondary | ICD-10-CM | POA: Diagnosis not present

## 2021-04-01 DIAGNOSIS — O365930 Maternal care for other known or suspected poor fetal growth, third trimester, not applicable or unspecified: Secondary | ICD-10-CM | POA: Diagnosis not present

## 2021-04-06 ENCOUNTER — Other Ambulatory Visit: Payer: Self-pay

## 2021-04-06 DIAGNOSIS — O24410 Gestational diabetes mellitus in pregnancy, diet controlled: Secondary | ICD-10-CM

## 2021-04-06 DIAGNOSIS — F419 Anxiety disorder, unspecified: Secondary | ICD-10-CM

## 2021-04-06 DIAGNOSIS — Z862 Personal history of diseases of the blood and blood-forming organs and certain disorders involving the immune mechanism: Secondary | ICD-10-CM

## 2021-04-06 DIAGNOSIS — O365931 Maternal care for other known or suspected poor fetal growth, third trimester, fetus 1: Secondary | ICD-10-CM

## 2021-04-08 ENCOUNTER — Other Ambulatory Visit: Payer: BC Managed Care – PPO

## 2021-04-11 ENCOUNTER — Other Ambulatory Visit: Payer: Self-pay

## 2021-04-11 DIAGNOSIS — O365931 Maternal care for other known or suspected poor fetal growth, third trimester, fetus 1: Secondary | ICD-10-CM

## 2021-04-11 DIAGNOSIS — Z862 Personal history of diseases of the blood and blood-forming organs and certain disorders involving the immune mechanism: Secondary | ICD-10-CM

## 2021-04-11 DIAGNOSIS — O09893 Supervision of other high risk pregnancies, third trimester: Secondary | ICD-10-CM

## 2021-04-11 DIAGNOSIS — F419 Anxiety disorder, unspecified: Secondary | ICD-10-CM

## 2021-04-11 DIAGNOSIS — O24410 Gestational diabetes mellitus in pregnancy, diet controlled: Secondary | ICD-10-CM

## 2021-04-13 ENCOUNTER — Other Ambulatory Visit: Payer: BC Managed Care – PPO

## 2021-04-14 ENCOUNTER — Other Ambulatory Visit: Payer: Self-pay

## 2021-04-14 ENCOUNTER — Inpatient Hospital Stay
Admission: RE | Admit: 2021-04-14 | Discharge: 2021-04-15 | DRG: 806 | Disposition: A | Discharge status: Home / Self Care | Payer: BC Managed Care – PPO | Attending: Obstetrics | Admitting: Obstetrics

## 2021-04-14 ENCOUNTER — Encounter: Payer: Self-pay | Admitting: Obstetrics and Gynecology

## 2021-04-14 DIAGNOSIS — O365930 Maternal care for other known or suspected poor fetal growth, third trimester, not applicable or unspecified: Principal | ICD-10-CM | POA: Diagnosis present

## 2021-04-14 DIAGNOSIS — Z3490 Encounter for supervision of normal pregnancy, unspecified, unspecified trimester: Secondary | ICD-10-CM | POA: Diagnosis present

## 2021-04-14 DIAGNOSIS — O9902 Anemia complicating childbirth: Secondary | ICD-10-CM | POA: Diagnosis present

## 2021-04-14 DIAGNOSIS — O99824 Streptococcus B carrier state complicating childbirth: Secondary | ICD-10-CM | POA: Diagnosis present

## 2021-04-14 DIAGNOSIS — Z20822 Contact with and (suspected) exposure to covid-19: Secondary | ICD-10-CM | POA: Diagnosis present

## 2021-04-14 DIAGNOSIS — Z3A38 38 weeks gestation of pregnancy: Secondary | ICD-10-CM

## 2021-04-14 DIAGNOSIS — D573 Sickle-cell trait: Secondary | ICD-10-CM | POA: Diagnosis present

## 2021-04-14 DIAGNOSIS — O1002 Pre-existing essential hypertension complicating childbirth: Secondary | ICD-10-CM | POA: Diagnosis present

## 2021-04-14 DIAGNOSIS — O24420 Gestational diabetes mellitus in childbirth, diet controlled: Secondary | ICD-10-CM | POA: Diagnosis present

## 2021-04-14 LAB — CBC
HCT: 38 % (ref 36.0–46.0)
Hemoglobin: 12.6 g/dL (ref 12.0–15.0)
MCH: 29.9 pg (ref 26.0–34.0)
MCHC: 33.2 g/dL (ref 30.0–36.0)
MCV: 90.3 fL (ref 80.0–100.0)
Platelets: 148 10*3/uL — ABNORMAL LOW (ref 150–400)
RBC: 4.21 MIL/uL (ref 3.87–5.11)
RDW: 16.1 % — ABNORMAL HIGH (ref 11.5–15.5)
WBC: 6.4 10*3/uL (ref 4.0–10.5)
nRBC: 0 % (ref 0.0–0.2)

## 2021-04-14 LAB — GLUCOSE, CAPILLARY
Glucose-Capillary: 87 mg/dL (ref 70–99)
Glucose-Capillary: 76 mg/dL (ref 70–99)

## 2021-04-14 LAB — RESP PANEL BY RT-PCR (FLU A&B, COVID) ARPGX2
Influenza A by PCR: NEGATIVE
Influenza B by PCR: NEGATIVE
SARS Coronavirus 2 by RT PCR: NEGATIVE

## 2021-04-14 LAB — TYPE AND SCREEN
ABO/RH(D): B POS
Antibody Screen: NEGATIVE

## 2021-04-14 MED ORDER — IBUPROFEN 600 MG PO TABS
600.0000 mg | ORAL_TABLET | Freq: Four times a day (QID) | ORAL | Status: DC
  Filled 2021-04-14 (×3): qty 1

## 2021-04-14 MED ORDER — LACTATED RINGERS IV SOLN: INTRAVENOUS | Status: DC

## 2021-04-14 MED ORDER — BENZOCAINE-MENTHOL 20-0.5 % EX AERO: 1.0000 "application " | INHALATION_SPRAY | CUTANEOUS | Status: DC | PRN

## 2021-04-14 MED ORDER — OXYCODONE HCL 5 MG PO TABS: 5.0000 mg | ORAL_TABLET | ORAL | Status: DC | PRN

## 2021-04-14 MED ORDER — ACETAMINOPHEN 325 MG PO TABS: 650.0000 mg | ORAL_TABLET | ORAL | Status: DC | PRN

## 2021-04-14 MED ORDER — ONDANSETRON HCL 4 MG/2ML IJ SOLN: 4.0000 mg | INTRAMUSCULAR | Status: DC | PRN

## 2021-04-14 MED ORDER — COCONUT OIL OIL
1.0000 "application " | TOPICAL_OIL | Status: DC | PRN
  Filled 2021-04-14: qty 120

## 2021-04-14 MED ORDER — FLEET ENEMA 7-19 GM/118ML RE ENEM: 1.0000 | ENEMA | Freq: Every day | RECTAL | Status: DC | PRN

## 2021-04-14 MED ORDER — OXYTOCIN BOLUS FROM INFUSION
333.0000 mL | Freq: Once | INTRAVENOUS | Status: AC
  Administered 2021-04-14: 333 mL via INTRAVENOUS

## 2021-04-14 MED ORDER — SODIUM CHLORIDE 0.9 % IV SOLN
INTRAVENOUS | Status: AC
  Filled 2021-04-14: qty 5

## 2021-04-14 MED ORDER — SODIUM CHLORIDE 0.9 % IV SOLN: 250.0000 mL | INTRAVENOUS | Status: DC | PRN

## 2021-04-14 MED ORDER — BISACODYL 10 MG RE SUPP: 10.0000 mg | Freq: Every day | RECTAL | Status: DC | PRN

## 2021-04-14 MED ORDER — SIMETHICONE 80 MG PO CHEW: 80.0000 mg | CHEWABLE_TABLET | ORAL | Status: DC | PRN

## 2021-04-14 MED ORDER — SODIUM CHLORIDE 0.9% FLUSH
3.0000 mL | Freq: Two times a day (BID) | INTRAVENOUS | Status: DC
  Administered 2021-04-14 – 2021-04-15 (×2): 3 mL via INTRAVENOUS

## 2021-04-14 MED ORDER — OXYTOCIN-SODIUM CHLORIDE 30-0.9 UT/500ML-% IV SOLN
1.0000 m[IU]/min | INTRAVENOUS | Status: DC
  Administered 2021-04-14: 2 m[IU]/min via INTRAVENOUS
  Filled 2021-04-14: qty 500

## 2021-04-14 MED ORDER — PENICILLIN G POT IN DEXTROSE 60000 UNIT/ML IV SOLN
3.0000 10*6.[IU] | INTRAVENOUS | Status: DC
  Administered 2021-04-14: 3 10*6.[IU] via INTRAVENOUS
  Filled 2021-04-14 (×5): qty 50

## 2021-04-14 MED ORDER — SODIUM CHLORIDE 0.9 % IV SOLN
5.0000 10*6.[IU] | Freq: Once | INTRAVENOUS | Status: AC
  Administered 2021-04-14: 5 10*6.[IU] via INTRAVENOUS

## 2021-04-14 MED ORDER — MISOPROSTOL 200 MCG PO TABS
ORAL_TABLET | ORAL | Status: AC
  Administered 2021-04-14: 800 ug
  Filled 2021-04-14: qty 4

## 2021-04-14 MED ORDER — ONDANSETRON HCL 4 MG PO TABS: 4.0000 mg | ORAL_TABLET | ORAL | Status: DC | PRN

## 2021-04-14 MED ORDER — ZOLPIDEM TARTRATE 5 MG PO TABS: 5.0000 mg | ORAL_TABLET | Freq: Every evening | ORAL | Status: DC | PRN

## 2021-04-14 MED ORDER — TERBUTALINE SULFATE 1 MG/ML IJ SOLN: 0.2500 mg | Freq: Once | INTRAMUSCULAR | Status: DC | PRN

## 2021-04-14 MED ORDER — ONDANSETRON HCL 4 MG/2ML IJ SOLN: 4.0000 mg | Freq: Four times a day (QID) | INTRAMUSCULAR | Status: DC | PRN

## 2021-04-14 MED ORDER — DIBUCAINE (PERIANAL) 1 % EX OINT: 1.0000 "application " | TOPICAL_OINTMENT | CUTANEOUS | Status: DC | PRN

## 2021-04-14 MED ORDER — LIDOCAINE HCL (PF) 1 % IJ SOLN
30.0000 mL | INTRAMUSCULAR | Status: DC | PRN
  Filled 2021-04-14: qty 30

## 2021-04-14 MED ORDER — FENTANYL CITRATE (PF) 100 MCG/2ML IJ SOLN: 50.0000 ug | INTRAMUSCULAR | Status: DC | PRN

## 2021-04-14 MED ORDER — AMMONIA AROMATIC IN INHA
RESPIRATORY_TRACT | Status: AC
  Filled 2021-04-14: qty 10

## 2021-04-14 MED ORDER — SENNOSIDES-DOCUSATE SODIUM 8.6-50 MG PO TABS
2.0000 | ORAL_TABLET | ORAL | Status: DC
  Administered 2021-04-14: 2 via ORAL
  Filled 2021-04-14: qty 2

## 2021-04-14 MED ORDER — PRENATAL MULTIVITAMIN CH
1.0000 | ORAL_TABLET | Freq: Every day | ORAL | Status: DC
  Administered 2021-04-15: 1 via ORAL
  Filled 2021-04-14: qty 1

## 2021-04-14 MED ORDER — WITCH HAZEL-GLYCERIN EX PADS: 1.0000 "application " | MEDICATED_PAD | CUTANEOUS | Status: DC | PRN

## 2021-04-14 MED ORDER — SODIUM CHLORIDE 0.9% FLUSH: 3.0000 mL | INTRAVENOUS | Status: DC | PRN

## 2021-04-14 MED ORDER — OXYTOCIN-SODIUM CHLORIDE 30-0.9 UT/500ML-% IV SOLN
2.5000 [IU]/h | INTRAVENOUS | Status: DC
  Administered 2021-04-14: 2.5 [IU]/h via INTRAVENOUS
  Filled 2021-04-14: qty 500

## 2021-04-14 MED ORDER — OXYTOCIN 10 UNIT/ML IJ SOLN
INTRAMUSCULAR | Status: AC
  Filled 2021-04-14: qty 2

## 2021-04-14 MED ORDER — SOD CITRATE-CITRIC ACID 500-334 MG/5ML PO SOLN: 30.0000 mL | ORAL | Status: DC | PRN

## 2021-04-14 MED ORDER — MISOPROSTOL 25 MCG QUARTER TABLET
25.0000 ug | ORAL_TABLET | ORAL | Status: DC | PRN
  Filled 2021-04-14: qty 1

## 2021-04-14 MED ORDER — LACTATED RINGERS IV SOLN: 500.0000 mL | INTRAVENOUS | Status: DC | PRN

## 2021-04-14 MED ORDER — DIPHENHYDRAMINE HCL 25 MG PO CAPS: 25.0000 mg | ORAL_CAPSULE | Freq: Four times a day (QID) | ORAL | Status: DC | PRN

## 2021-04-14 NOTE — H&P (Addendum)
OB History & Physical   History of Present Illness:  Chief Complaint:   HPI:  Brenda Rogers is a 35 y.o. [id number] female at [redacted]w[redacted]d dated by LMP.  She presents to L&D for IOL for IUGR, CHTN, and A1 GDM  She reports:  -active fetal movement -no leakage of fluid -no vaginal bleeding - contractions currently every 8-10 minutes  Pregnancy Issues: 1. IUGR EFW 7% 2. CHTN 3. A1 GDM 4. H/O Preterm delivery at 36 weeks 5. Sickle Cell Trait 6. Anemia    Maternal Medical History:   Past Medical History:  Diagnosis Date   Gestational diabetes    Placenta previa    Sickle cell trait (HCC)     History reviewed. No pertinent surgical history.  No Known Allergies  Prior to Admission medications   Medication Sig Start Date End Date Taking? Authorizing Provider  aspirin 81 MG chewable tablet Chew 81 mg by mouth daily.   Yes [provider]  ferrous sulfate 325 (65 FE) MG tablet Take 325 mg by mouth daily with breakfast.   Yes [provider]  Prenatal Vit-Fe Fumarate-FA (MULTIVITAMIN-PRENATAL) 27-0.8 MG TABS tablet Take 1 tablet by mouth daily at 12 noon.   Yes [provider]     Prenatal care site: [facility]  Social History: She  reports that she has never smoked. She has never used smokeless tobacco. She reports that she does not drink alcohol and does not use drugs.  Family History: family history is not on file.   Review of Systems: A full review of systems was performed and negative except as noted in the HPI.    Physical Exam:  Vital Signs: BP 124/78   Pulse 79   Temp 98.8 F (37.1 C) (Oral)   Resp 20   Ht  (1.473 m)   Wt 62.1 kg   LMP  (LMP Unknown)   SpO2 100%   BMI 28.61 kg/m   General:   alert, cooperative, and appears stated age  Skin:  normal  Neurologic:    Alert & oriented x 3  Lungs:   clear to auscultation bilaterally  Heart:   regular rate and rhythm, S1, S2 normal, no murmur, click, rub or gallop   Abdomen:  soft, non-tender; bowel sounds normal; no masses,  no organomegaly and Gravid  Pelvis:  Exam deferred.  FHT:  145 BPM  Presentations: cephalic  Cervix:    Dilation: 3cm   Effacement: 50%   Station:  -3   Consistency: soft   Position: middle  Extremities: : non-tender, symmetric, no edema bilaterally.  DTRs: +2    EFW: 13th%  Results for orders placed or performed during the hospital encounter of 04/14/21 (from the past 24 hour(s))  CBC     Status: Abnormal   Collection Time: 04/14/21  8:30 AM  Result Value Ref Range   WBC 6.4 4.0 - 10.5 K/uL   RBC 4.21 3.87 - 5.11 MIL/uL   Hemoglobin 12.6 12.0 - 15.0 g/dL   HCT 45.4 09.8 - 11.9 %   MCV 90.3 80.0 - 100.0 fL   MCH 29.9 26.0 - 34.0 pg   MCHC 33.2 30.0 - 36.0 g/dL   RDW 14.7 (H) 82.9 - 56.2 %   Platelets 148 (L) 150 - 400 K/uL   nRBC 0.0 0.0 - 0.2 %  Resp Panel by RT-PCR (Flu A&B, Covid) Nasopharyngeal Swab     Status: None   Collection Time: 04/14/21  8:33 AM  Specimen: Nasopharyngeal Swab; Nasopharyngeal(NP) swabs in vial transport medium  Result Value Ref Range   SARS Coronavirus 2 by RT PCR NEGATIVE NEGATIVE   Influenza A by PCR NEGATIVE NEGATIVE   Influenza B by PCR NEGATIVE NEGATIVE  Type and screen     Status: None   Collection Time: 04/14/21  9:23 AM  Result Value Ref Range   ABO/RH(D) B POS    Antibody Screen NEG    Sample Expiration      04/17/2021,2359 Performed at [facility], [address] [place], [place] [address]   Glucose, capillary     Status: None   Collection Time: 04/14/21 11:43 AM  Result Value Ref Range   Glucose-Capillary 87 70 - 99 mg/dL   Comment 1 Notify RN   Glucose, capillary     Status: None   Collection Time: 04/14/21  2:23 PM  Result Value Ref Range   Glucose-Capillary 76 70 - 99 mg/dL   Comment 1 Notify RN     Pertinent Results:  Prenatal Labs: Blood type/Rh B Pos  Antibody screen neg  Rubella Immune  Varicella Immune  RPR NR  HBsAg Neg  HIV NR  GC  neg  Chlamydia neg  Genetic screening negative  1 hour GTT 152  3 hour GTT 89, 162,135,130  GBS Positive   FHT: FHR: 145 bpm, variability: moderate,  accelerations:  Present,  decelerations:  Absent Category/reactivity:  Category I TOCO: irregular, every 7-10 minutes    Cephalic by leopolds  [place] MFM FETAL BPP WO NON STRESS  Result Date: 04/01/2021 ----------------------------------------------------------------------  OBSTETRICS REPORT                       (Signed Final 04/01/2021 11:15 am) ---------------------------------------------------------------------- Patient Info  ID #:       [id number]                          D.O.B.:  07/26/1985 (35 yrs)  Name:       Brenda Rogers                   Visit Date: 04/01/2021 10:32 am ---------------------------------------------------------------------- Performed By  Attending:        Lin Landsman      Ref. Address:     [address]                    MD                                                             [place], [place],                                                             [place] [address]  Performed By:     Birdena Crandall        [facility]                    RDMS,RVT  F[facility] Care at                                                             [facility]  Referred By:      Christeen Douglas MD ---------------------------------------------------------------------- Orders  #  Description                           Code        Ordered By  1  [place] MFM FETAL BPP WO NON               76819.01    CORENTHIAN     STRESS                                            BOOKER  2  [place] MFM UA CORD DOPPLER                76820.02    Lin Landsman ----------------------------------------------------------------------  #  Order #                     Accession #                Episode #  1  [id number]                   [id number]                  [id number]  2  [id number]                   [id number]                 [id number] ---------------------------------------------------------------------- Indications  Diabetes - Gestational                         O24.419  Hypertension - Chronic/Pre-existing (ASA)      O10.019  Maternal care for known or suspected poor      O36.5931  fetal growth, third trimester, fetus 1 IUGR  Poor obstetric history (prior pre-term labor)  O09.219  Poor obstetric history: Previous gestational   O09.299  diabetes  Poor obstetric history: Previous fetal growth  O09.299  restriction (FGR)  Low risk NIPS  [redacted] weeks gestation of pregnancy                Z3A.37 ---------------------------------------------------------------------- Fetal Evaluation  Num Of Fetuses:         1  Fetal Heart Rate(bpm):  155  Cardiac Activity:       Observed  Presentation:           Cephalic  Placenta:  Anterior  P. Cord Insertion:      Visualized, central  Amniotic Fluid  AFI FV:      Within normal limits  AFI Sum(cm)     %Tile       Largest Pocket(cm)  12.4            42          4  RUQ(cm)       RLQ(cm)       LUQ(cm)        LLQ(cm)  3.5           1.4           3.5            4 ---------------------------------------------------------------------- Biophysical Evaluation  Amniotic F.V:   Within normal limits       F. Tone:        Observed  F. Movement:    Observed                   Score:          8/8  F. Breathing:   Observed ---------------------------------------------------------------------- Gestational Age  LMP:           37w 0d        Date:  07/16/20                 EDD:   04/22/21  Best:          37w 0d     Det. By:  LMP  (07/16/20)          EDD:   04/22/21 ---------------------------------------------------------------------- Anatomy  Ventricles:            Appears normal         Kidneys:                Appear normal  Stomach:               Appears normal, left   Bladder:                Appears normal                         sided  ---------------------------------------------------------------------- Doppler - Fetal Vessels  Umbilical Artery   S/D     %tile      RI    %tile      PI    %tile            ADFV    RDFV   2.63       68    0.62       75    0.92       73               No      No ---------------------------------------------------------------------- Impression  Antenatal testing due to IUGR with an EFW 11th% with an  AC < 4th% on a prior exam  Biophysical profile 8/8 with good fetal movement and  amniotic fluid volume  UA Dopplers are normal with no evidence of AEDF or REDF  She reports good fetal movement and amniotic fluid volume.  A1GDM- [name] reports good blood sugars with one  abnormal fasting. ---------------------------------------------------------------------- Recommendations  Continue weekly testing with UA Dopplers  She is scheduled for IOL on 11/09 ----------------------------------------------------------------------               Lin Landsman, MD Electronically Signed Final Report  04/01/2021 11:15 am ----------------------------------------------------------------------  [place] MFM FETAL BPP WO NON STRESS  Result Date: 03/25/2021 ----------------------------------------------------------------------  OBSTETRICS REPORT                       (Signed Final 03/25/2021 09:03 am) ---------------------------------------------------------------------- Patient Info  ID #:       [id number]                          D.O.B.:  1985/12/13 (35 yrs)  Name:       Brenda Rogers                   Visit Date: 03/25/2021 08:04 am ---------------------------------------------------------------------- Performed By  Attending:        Lin Landsman      Ref. Address:     [address]                    MD                                                             [place], [place],                                                             [place] [address]  Performed By:     Reinaldo Raddle            Location[facility]                     RDMS                                     Fetal Care at                                                             [facility]  Referred By:      Christeen Douglas MD ---------------------------------------------------------------------- Orders  #  Description                           Code        Ordered By  1  [place] MFM UA CORD DOPPLER                76820.02    RAVI SHANKAR  2  [place] MFM FETAL BPP WO NON               19147.82    RAVI SHANKAR     STRESS ----------------------------------------------------------------------  #  Order #  Accession #                Episode #  1  [id number]                   [id number]                 [id number]  2  [id number]                   [id number]                 [id number] ---------------------------------------------------------------------- Indications  [redacted] weeks gestation of pregnancy                Z3A.36  Diabetes - Gestational                         O24.419  Hypertension - Chronic/Pre-existing            O10.019  Maternal care for known or suspected poor      O36.5931  fetal growth, third trimester, fetus 1 IUGR  Poor obstetric history (prior pre-term labor)  O09.219  Poor obstetric history: Previous gestational   O09.299  diabetes  Poor obstetric history: Previous fetal growth  O09.299  restriction (FGR)  Low risk NIPS ---------------------------------------------------------------------- Fetal Evaluation  Num Of Fetuses:         1  Fetal Heart Rate(bpm):  143  Cardiac Activity:       Observed  Presentation:           Cephalic  Placenta:               Anterior  P. Cord Insertion:      Previously Visualized  AFI Sum(cm)     %Tile       Largest Pocket(cm)  17.26           64          6.47  RUQ(cm)       RLQ(cm)       LUQ(cm)        LLQ(cm)  4.15          3.8           6.47           2.84 ---------------------------------------------------------------------- Biophysical Evaluation  Amniotic F.V:   Within normal limits        F. Tone:        Observed  F. Movement:    Observed                   Score:          8/8  F. Breathing:   Observed ---------------------------------------------------------------------- Biometry  LV:        3.1  mm ---------------------------------------------------------------------- Gestational Age  LMP:           36w 0d        Date:  07/16/20                 EDD:   04/22/21  Best:          Stevie Kern 0d     Det. By:  LMP  (07/16/20)          EDD:   04/22/21 ---------------------------------------------------------------------- Anatomy  Ventricles:            Appears normal         Stomach:  Appears normal, left                                                                        sided  Heart:                 Appears normal         Kidneys:                Appear normal                         (4CH, axis, and                         situs)  Diaphragm:             Appears normal         Bladder:                Appears normal ---------------------------------------------------------------------- Doppler - Fetal Vessels  Umbilical Artery   S/D     %tile      RI    %tile      PI    %tile            ADFV    RDFV   2.39       49    0.58       54    0.84       54               No      No ---------------------------------------------------------------------- Cervix Uterus Adnexa  Cervix  Not visualized (advanced GA >24wks) ---------------------------------------------------------------------- Impression  Antenatal testing performed due to fetal growth restriction  with AC 4th% and EFW 11% on the prior exam.  The biophysical profile was 8/8 with good fetal movement and  amniotic fluid volume.  The UA Dopplers are normal without evidence of AEDF or  REDF.  Ms. Huestis also has A1GDM. She reports good fetal  movement and normal blood sugars. ---------------------------------------------------------------------- Recommendations  Continue weekly testing with UA Dopplers  She is scheduled for delivery on 11/05 (38 weeks)  ----------------------------------------------------------------------               Lin Landsman, MD Electronically Signed Final Report   03/25/2021 09:03 am ----------------------------------------------------------------------  [place] MFM FETAL BPP WO NON STRESS  Result Date: 03/18/2021 ----------------------------------------------------------------------  OBSTETRICS REPORT                    (Corrected Final 03/18/2021 03:36 pm) ---------------------------------------------------------------------- Patient Info  ID #:       [id number]                          D.O.B.:  05-20-1986 (35 yrs)  Name:       Brenda Rogers                   Visit Date: 03/18/2021 03:17 pm ---------------------------------------------------------------------- Performed By  Attending:        Noralee Space MD        Ref. Address:     [address] [place],                                                             [place] [address]  Performed By:     Lorn Junes,           Location[facility]         [facility]                    RDMS, RDCS                               Fetal Care at                                                             [facility]  Referred By:      Christeen Douglas MD ---------------------------------------------------------------------- Orders  #  Description                           Code        Ordered By  1  [place] MFM FETAL BPP WO NON               76819.01    YU FANG     STRESS  2  [place] MFM UA CORD DOPPLER                76820.02    YU FANG  3  [place] MFM OB FOLLOW UP                   60454.09    YU FANG ----------------------------------------------------------------------  #  Order #                     Accession #                Episode #  1  [id number]                   [id number]                 [id number]  2  [id number]                   [id number]                 [id number]  3  [id number]                   [id number]                  [id number] ---------------------------------------------------------------------- Indications  Diabetes - Gestational                         O24.419  Hypertension - Chronic/Pre-existing            O10.019  Maternal care for known or suspected poor  N62.9528  fetal growth, third trimester, fetus 1 IUGR  Poor obstetric history (prior pre-term labor)  O09.219  Poor obstetric history: Previous gestational   O09.299  diabetes  Poor obstetric history: Previous fetal growth  O09.299  restriction (FGR)  [redacted] weeks gestation of pregnancy                Z3A.35  Low risk NIPS ---------------------------------------------------------------------- Fetal Evaluation  Num Of Fetuses:         1  Fetal Heart Rate(bpm):  155  Cardiac Activity:       Observed  Presentation:           Cephalic  Placenta:               Anterior  Amniotic Fluid  AFI FV:      Within normal limits  AFI Sum(cm)     %Tile       Largest Pocket(cm)  10.9            27          4.1  RUQ(cm)       RLQ(cm)       LUQ(cm)        LLQ(cm)  1.9           2.1           2.8            4.1 ---------------------------------------------------------------------- Biophysical Evaluation  Amniotic F.V:   Within normal limits       F. Tone:        Observed  F. Movement:    Observed                   Score:          8/8  F. Breathing:   Observed ---------------------------------------------------------------------- Biometry  BPD:      86.9  mm     G. Age:  35w 1d         55  %    CI:        77.58   %    70 - 86                                                          FL/HC:      21.1   %    20.1 - 22.3  HC:      312.3  mm     G. Age:  35w 0d         17  %    HC/AC:      1.10        0.93 - 1.11  AC:      284.8  mm     G. Age:  32w 4d          4  %    FL/BPD:     75.8   %    71 - 87  FL:       65.9  mm     G. Age:  34w 0d         18  %    FL/AC:      23.1   %    20 - 24  HUM:        56  mm  G. Age:  32w 4d         16  %  Est. FW:    2189  gm    4 lb 13 oz      11  %  ---------------------------------------------------------------------- Gestational Age  LMP:           35w 0d        Date:  07/16/20                 EDD:   04/22/21  U/S Today:     34w 1d                                        EDD:   04/28/21  Best:          35w 0d     Det. By:  LMP  (07/16/20)          EDD:   04/22/21 ---------------------------------------------------------------------- Doppler - Fetal Vessels  Umbilical Artery   S/D     %tile      RI    %tile      PI    %tile            ADFV    RDFV   2.65       62    0.62       67    0.96       73               No      No ---------------------------------------------------------------------- Impression  Fetal growth restriction.  Patient return for fetal growth  assessment and antenatal testing.  Today's ultrasound, the estimated fetal weight is at the 11th  percentile and the abdominal circumference measurement is  at the 6 percentile.  Good interval growth is seen over 3  weeks (561 g).  Amniotic fluid is normal and good fetal  activity seen.  Antenatal testing is reassuring.  Umbilical  artery Doppler showed normal forward diastolic flow.  BPP  8/8.  Patient is 4 feet 11 inches tall.  I explained that fetal growth  restriction is difficult to differentiate from a constitutionally  small fetus.  Provided antenatal testing is reassuring, patient  can deliver at 38 or [redacted] weeks gestation.  Patient wants to deliver at [redacted] weeks gestation.  Patient has a recent diagnosis of gestational diabetes that is  well controlled on diet. ---------------------------------------------------------------------- Recommendations  -BPP and UA Doppler next week at our office.  -NST at [redacted] weeks gestation at your office  -Delivery at [redacted] weeks gestation ----------------------------------------------------------------------                       Noralee Space, MD Electronically Signed Corrected Final Report  03/18/2021 03:36 pm  ----------------------------------------------------------------------  [place] MFM OB FOLLOW UP  Result Date: 03/18/2021 ----------------------------------------------------------------------  OBSTETRICS REPORT                    (Corrected Final 03/18/2021 03:36 pm) ---------------------------------------------------------------------- Patient Info  ID #:       [id number]                          D.O.B.:  December 07, 1985 (35 yrs)  Name:       Brenda Rogers  Visit Date: 03/18/2021 03:17 pm ---------------------------------------------------------------------- Performed By  Attending:        Noralee Space MD        Ref. Address:     [address]                                                             [place], [place],                                                             [place] [address]  Performed By:     Lorn Junes,           [facility]                    RD[facility], RDCS                               Fetal Care at                                                             [facility]  Referred By:      Christeen Douglas MD ---------------------------------------------------------------------- Orders  #  Description                           Code        Ordered By  1  [place] MFM FETAL BPP WO NON               76819.01    YU FANG     STRESS  2  [place] MFM UA CORD DOPPLER                76820.02    YU FANG  3  [place] MFM OB FOLLOW UP                   60454.09    YU FANG ----------------------------------------------------------------------  #  Order #                     Accession #                Episode #  1  [id number]                   [id number]                 [id number]  2  [id number]                   [id number]                 [id number]  3  [id number]                   [id number]                 [id number] ---------------------------------------------------------------------- Indications  Diabetes - Gestational                         O24.419  Hypertension -  Chronic/Pre-existing            O10.019  Maternal care for known or suspected poor      O36.5931  fetal growth, third trimester, fetus 1 IUGR  Poor obstetric history (prior pre-term labor)  O09.219  Poor obstetric history: Previous gestational   O09.299  diabetes  Poor obstetric history: Previous fetal growth  O09.299  restriction (FGR)  [redacted] weeks gestation of pregnancy                Z3A.35  Low risk NIPS ---------------------------------------------------------------------- Fetal Evaluation  Num Of Fetuses:         1  Fetal Heart Rate(bpm):  155  Cardiac Activity:       Observed  Presentation:           Cephalic  Placenta:               Anterior  Amniotic Fluid  AFI FV:      Within normal limits  AFI Sum(cm)     %Tile       Largest Pocket(cm)  10.9            27          4.1  RUQ(cm)       RLQ(cm)       LUQ(cm)        LLQ(cm)  1.9           2.1           2.8            4.1 ---------------------------------------------------------------------- Biophysical Evaluation  Amniotic F.V:   Within normal limits       F. Tone:        Observed  F. Movement:    Observed                   Score:          8/8  F. Breathing:   Observed ---------------------------------------------------------------------- Biometry  BPD:      86.9  mm     G. Age:  35w 1d         55  %    CI:        77.58   %    70 - 86                                                          FL/HC:      21.1   %    20.1 - 22.3  HC:      312.3  mm     G. Age:  35w 0d         17  %    HC/AC:      1.10        0.93 - 1.11  AC:      284.8  mm  G. Age:  32w 4d          4  %    FL/BPD:     75.8   %    71 - 87  FL:       65.9  mm     G. Age:  34w 0d         18  %    FL/AC:      23.1   %    20 - 24  HUM:        56  mm     G. Age:  32w 4d         16  %  Est. FW:    2189  gm    4 lb 13 oz      11  % ---------------------------------------------------------------------- Gestational Age  LMP:           35w 0d        Date:  07/16/20                 EDD:   04/22/21  U/S  Today:     34w 1d                                        EDD:   04/28/21  Best:          35w 0d     Det. By:  LMP  (07/16/20)          EDD:   04/22/21 ---------------------------------------------------------------------- Doppler - Fetal Vessels  Umbilical Artery   S/D     %tile      RI    %tile      PI    %tile            ADFV    RDFV   2.65       62    0.62       67    0.96       73               No      No ---------------------------------------------------------------------- Impression  Fetal growth restriction.  Patient return for fetal growth  assessment and antenatal testing.  Today's ultrasound, the estimated fetal weight is at the 11th  percentile and the abdominal circumference measurement is  at the 6 percentile.  Good interval growth is seen over 3  weeks (561 g).  Amniotic fluid is normal and good fetal  activity seen.  Antenatal testing is reassuring.  Umbilical  artery Doppler showed normal forward diastolic flow.  BPP  8/8.  Patient is 4 feet 11 inches tall.  I explained that fetal growth  restriction is difficult to differentiate from a constitutionally  small fetus.  Provided antenatal testing is reassuring, patient  can deliver at 38 or [redacted] weeks gestation.  Patient wants to deliver at [redacted] weeks gestation.  Patient has a recent diagnosis of gestational diabetes that is  well controlled on diet. ---------------------------------------------------------------------- Recommendations  -BPP and UA Doppler next week at our office.  -NST at [redacted] weeks gestation at your office  -Delivery at [redacted] weeks gestation ----------------------------------------------------------------------                       Noralee Space, MD Electronically Signed Corrected Final Report  03/18/2021 03:36 pm ----------------------------------------------------------------------  [place] MFM UA CORD  DOPPLER  Result Date: 04/01/2021 ----------------------------------------------------------------------  OBSTETRICS REPORT                        (Signed Final 04/01/2021 11:15 am) ---------------------------------------------------------------------- Patient Info  ID #:       [id number]                          D.O.B.:  September 06, 1985 (35 yrs)  Name:       Brenda Rogers                   Visit Date: 04/01/2021 10:32 am ---------------------------------------------------------------------- Performed By  Attending:        Lin Landsman      Ref. Address:     [address]                    [address], [place],                                                             [place] [address]  Performed By:     Birdena Crandall        [facility]                    RDMS,RVT                                 Fetal Care at                                                             [facility]  Referred By:      Christeen Douglas MD ---------------------------------------------------------------------- Orders  #  Description                           Code        Ordered By  1  [place] MFM FETAL BPP WO NON               19147.82    CORENTHIAN     STRESS                                            BOOKER  2  [place] MFM UA CORD DOPPLER  16109.60    Lin Landsman ----------------------------------------------------------------------  #  Order #                     Accession #                Episode #  1  [id number]                   [id number]                 [id number]  2  [id number]                   [id number]                 [id number] ---------------------------------------------------------------------- Indications  Diabetes - Gestational                         O24.419  Hypertension - Chronic/Pre-existing (ASA)      O10.019  Maternal care for known or suspected poor      O36.5931  fetal growth, third trimester, fetus 1 IUGR  Poor obstetric history (prior pre-term labor)  O09.219  Poor obstetric history:  Previous gestational   O09.299  diabetes  Poor obstetric history: Previous fetal growth  O09.299  restriction (FGR)  Low risk NIPS  [redacted] weeks gestation of pregnancy                Z3A.37 ---------------------------------------------------------------------- Fetal Evaluation  Num Of Fetuses:         1  Fetal Heart Rate(bpm):  155  Cardiac Activity:       Observed  Presentation:           Cephalic  Placenta:               Anterior  P. Cord Insertion:      Visualized, central  Amniotic Fluid  AFI FV:      Within normal limits  AFI Sum(cm)     %Tile       Largest Pocket(cm)  12.4            42          4  RUQ(cm)       RLQ(cm)       LUQ(cm)        LLQ(cm)  3.5           1.4           3.5            4 ---------------------------------------------------------------------- Biophysical Evaluation  Amniotic F.V:   Within normal limits       F. Tone:        Observed  F. Movement:    Observed                   Score:          8/8  F. Breathing:   Observed ---------------------------------------------------------------------- Gestational Age  LMP:           37w 0d        Date:  07/16/20  EDD:   04/22/21  Best:          37w 0d     Det. By:  LMP  (07/16/20)          EDD:   04/22/21 ---------------------------------------------------------------------- Anatomy  Ventricles:            Appears normal         Kidneys:                Appear normal  Stomach:               Appears normal, left   Bladder:                Appears normal                         sided ---------------------------------------------------------------------- Doppler - Fetal Vessels  Umbilical Artery   S/D     %tile      RI    %tile      PI    %tile            ADFV    RDFV   2.63       68    0.62       75    0.92       73               No      No ---------------------------------------------------------------------- Impression  Antenatal testing due to IUGR with an EFW 11th% with an  AC < 4th% on a prior exam  Biophysical profile 8/8 with good fetal  movement and  amniotic fluid volume  UA Dopplers are normal with no evidence of AEDF or REDF  She reports good fetal movement and amniotic fluid volume.  A1GDM- [name] reports good blood sugars with one  abnormal fasting. ---------------------------------------------------------------------- Recommendations  Continue weekly testing with UA Dopplers  She is scheduled for IOL on 11/09 ----------------------------------------------------------------------               Lin Landsman, MD Electronically Signed Final Report   04/01/2021 11:15 am ----------------------------------------------------------------------  [place] MFM UA CORD DOPPLER  Result Date: 03/25/2021 ----------------------------------------------------------------------  OBSTETRICS REPORT                       (Signed Final 03/25/2021 09:03 am) ---------------------------------------------------------------------- Patient Info  ID #:       [id number]                          D.O.B.:  Dec 09, 1985 (35 yrs)  Name:       Brenda Rogers                   Visit Date: 03/25/2021 08:04 am ---------------------------------------------------------------------- Performed By  Attending:        Lin Landsman      Ref. Address:     [address]                    [address], [place],  [place] [address]  Performed By:     Reinaldo Raddle            Location[facility]         [facility]                    RDMS                                     Fetal Care at                                                             [facility]  Referred By:      Christeen Douglas MD ---------------------------------------------------------------------- Orders  #  Description                           Code        Ordered By  1  [place] MFM UA CORD DOPPLER                76820.02    RAVI SHANKAR  2  [place] MFM FETAL BPP WO NON               76819.01    RAVI  [facility]     STRESS ----------------------------------------------------------------------  #  Order #                     Accession #                Episode #  1  [id number]                   [id number]                 [id number]  2  [id number]                   [id number]                 [id number] ---------------------------------------------------------------------- Indications  [redacted] weeks gestation of pregnancy                Z3A.36  Diabetes - Gestational                         O24.419  Hypertension - Chronic/Pre-existing            O10.019  Maternal care for known or suspected poor      O36.5931  fetal growth, third trimester, fetus 1 IUGR  Poor obstetric history (prior pre-term labor)  O09.219  Poor obstetric history: Previous gestational   O09.299  diabetes  Poor obstetric history: Previous fetal growth  O09.299  restriction (FGR)  Low risk NIPS ---------------------------------------------------------------------- Fetal Evaluation  Num Of Fetuses:         1  Fetal Heart Rate(bpm):  143  Cardiac Activity:       Observed  Presentation:           Cephalic  Placenta:  Anterior  P. Cord Insertion:      Previously Visualized  AFI Sum(cm)     %Tile       Largest Pocket(cm)  17.26           64          6.47  RUQ(cm)       RLQ(cm)       LUQ(cm)        LLQ(cm)  4.15          3.8           6.47           2.84 ---------------------------------------------------------------------- Biophysical Evaluation  Amniotic F.V:   Within normal limits       F. Tone:        Observed  F. Movement:    Observed                   Score:          8/8  F. Breathing:   Observed ---------------------------------------------------------------------- Biometry  LV:        3.1  mm ---------------------------------------------------------------------- Gestational Age  LMP:           36w 0d        Date:  07/16/20                 EDD:   04/22/21  Best:          Stevie Kern 0d     Det. By:  LMP  (07/16/20)          EDD:   04/22/21  ---------------------------------------------------------------------- Anatomy  Ventricles:            Appears normal         Stomach:                Appears normal, left                                                                        sided  Heart:                 Appears normal         Kidneys:                Appear normal                         (4CH, axis, and                         situs)  Diaphragm:             Appears normal         Bladder:                Appears normal ---------------------------------------------------------------------- Doppler - Fetal Vessels  Umbilical Artery   S/D     %tile      RI    %tile      PI    %tile            ADFV    RDFV   2.39       49    0.58  54    0.84       54               No      No ---------------------------------------------------------------------- Cervix Uterus Adnexa  Cervix  Not visualized (advanced GA >24wks) ---------------------------------------------------------------------- Impression  Antenatal testing performed due to fetal growth restriction  with AC 4th% and EFW 11% on the prior exam.  The biophysical profile was 8/8 with good fetal movement and  amniotic fluid volume.  The UA Dopplers are normal without evidence of AEDF or  REDF.  Ms. Seher also has A1GDM. She reports good fetal  movement and normal blood sugars. ---------------------------------------------------------------------- Recommendations  Continue weekly testing with UA Dopplers  She is scheduled for delivery on 11/05 (38 weeks) ----------------------------------------------------------------------               Lin Landsman, MD Electronically Signed Final Report   03/25/2021 09:03 am ----------------------------------------------------------------------  [place] MFM UA CORD DOPPLER  Result Date: 03/18/2021 ----------------------------------------------------------------------  OBSTETRICS REPORT                    (Corrected Final 03/18/2021 03:36 pm)  ---------------------------------------------------------------------- Patient Info  ID #:       [id number]                          D.O.B.:  [birth year] (35 yrs)  Name:       Brenda Rogers                   Visit Date: 03/18/2021 03:17 pm ---------------------------------------------------------------------- Performed By  Attending:        Noralee Space MD        Ref. Address:     [address]                                                             [place], [place],                                                             [place] [address]  Performed By:     Lorn Junes,           Location:         [facility]                    RDMS, RDCS                               Fetal Care at                                                             [facility]  Referred By:      Toma Copier  BEASLEY MD ---------------------------------------------------------------------- Orders  #  Description                           Code        Ordered By  1  [place] MFM FETAL BPP WO NON               76819.01    YU FANG     STRESS  2  [place] MFM UA CORD DOPPLER                76820.02    YU FANG  3  [place] MFM OB FOLLOW UP                   76816.01    YU FANG ----------------------------------------------------------------------  #  Order #                     Accession #                Episode #  1  [id number]                   [id number]                 [id number]  2  [id number]                   [id number]                 [id number]  3  [id number]                   [id number]                 [id number] ---------------------------------------------------------------------- Indications  Diabetes - Gestational                         O24.419  Hypertension - Chronic/Pre-existing            O10.019  Maternal care for known or suspected poor      O36.5931  fetal growth, third trimester, fetus 1 IUGR  Poor obstetric history (prior pre-term labor)  O09.219  Poor obstetric history: Previous gestational   O09.299  diabetes  Poor  obstetric history: Previous fetal growth  O09.299  restriction (FGR)  [redacted] weeks gestation of pregnancy                Z3A.35  Low risk NIPS ---------------------------------------------------------------------- Fetal Evaluation  Num Of Fetuses:         1  Fetal Heart Rate(bpm):  155  Cardiac Activity:       Observed  Presentation:           Cephalic  Placenta:               Anterior  Amniotic Fluid  AFI FV:      Within normal limits  AFI Sum(cm)     %Tile       Largest Pocket(cm)  10.9            27          4.1  RUQ(cm)       RLQ(cm)       LUQ(cm)        LLQ(cm)  1.9           2.1           2.8  4.1 ---------------------------------------------------------------------- Biophysical Evaluation  Amniotic F.V:   Within normal limits       F. Tone:        Observed  F. Movement:    Observed                   Score:          8/8  F. Breathing:   Observed ---------------------------------------------------------------------- Biometry  BPD:      86.9  mm     G. Age:  35w 1d         55  %    CI:        77.58   %    70 - 86                                                          FL/HC:      21.1   %    20.1 - 22.3  HC:      312.3  mm     G. Age:  35w 0d         17  %    HC/AC:      1.10        0.93 - 1.11  AC:      284.8  mm     G. Age:  32w 4d          4  %    FL/BPD:     75.8   %    71 - 87  FL:       65.9  mm     G. Age:  34w 0d         18  %    FL/AC:      23.1   %    20 - 24  HUM:        56  mm     G. Age:  32w 4d         16  %  Est. FW:    2189  gm    4 lb 13 oz      11  % ---------------------------------------------------------------------- Gestational Age  LMP:           35w 0d        Date:  07/16/20                 EDD:   04/22/21  U/S Today:     34w 1d                                        EDD:   04/28/21  Best:          35w 0d     Det. By:  LMP  (07/16/20)          EDD:   04/22/21 ---------------------------------------------------------------------- Doppler - Fetal Vessels  Umbilical Artery   S/D      %tile      RI    %tile      PI    %tile            ADFV    RDFV   2.65       62  0.62       67    0.96       73               No      No ---------------------------------------------------------------------- Impression  Fetal growth restriction.  Patient return for fetal growth  assessment and antenatal testing.  Today's ultrasound, the estimated fetal weight is at the 11th  percentile and the abdominal circumference measurement is  at the 6 percentile.  Good interval growth is seen over 3  weeks (561 g).  Amniotic fluid is normal and good fetal  activity seen.  Antenatal testing is reassuring.  Umbilical  artery Doppler showed normal forward diastolic flow.  BPP  8/8.  Patient is 4 feet 11 inches tall.  I explained that fetal growth  restriction is difficult to differentiate from a constitutionally  small fetus.  Provided antenatal testing is reassuring, patient  can deliver at 38 or [redacted] weeks gestation.  Patient wants to deliver at [redacted] weeks gestation.  Patient has a recent diagnosis of gestational diabetes that is  well controlled on diet. ---------------------------------------------------------------------- Recommendations  -BPP and UA Doppler next week at our office.  -NST at [redacted] weeks gestation at your office  -Delivery at [redacted] weeks gestation ----------------------------------------------------------------------                       Noralee Space, MD Electronically Signed Corrected Final Report  03/18/2021 03:36 pm ----------------------------------------------------------------------    Assessment:  Ivone Point is a 35 y.o. Z6X0960 female at [redacted]w[redacted]d with IUGR, CHTN, A1GDM.   Plan:  1. Admit to Labor & Delivery; consents reviewed and obtained  2. Fetal Well being  - Fetal Tracing: Category 1 - GBS-Pos - Presentation: vertex confirmed by [place] by MFM and SVE   3. Routine OB: - Prenatal labs reviewed, as above - Rh Pos - CBC & T&S on admit - Clear fluids, IVF  4. Monitoring of induction of  Labor -  Contractions -external toco in place -  Pelvis proven for trial of labor -  Plan for induction with Pitocin -  Plan for continuous fetal monitoring  -  Maternal pain control as desired: none - Anticipate vaginal delivery  5. Post Partum Planning: - Infant feeding: TBD - Contraception: Husband to have vasectomy  Chari Manning, CNM 04/14/2021 2:33 PM

## 2021-04-14 NOTE — Discharge Summary (Signed)
Obstetrical Discharge Summary  Patient Name: Brenda Rogers DOB: [birth year] MRN: [id number]  Date of Admission: 04/14/2021 Date of Delivery: 04/14/21  Delivered by: Avelino Leeds, CNM  Date of Discharge: 04/15/2021  Primary OB: [facility] LMP: 07/16/20 EDC Estimated Date of Delivery: 04/22/21 Gestational Age at Delivery: [redacted]w[redacted]d   Antepartum complications:  1. IUGR EFW 11% 2. CHTN 3. A1 GDM 4. H/O Preterm delivery at 36 weeks 5. Sickle Cell Trait 6. Anemia   Admitting Diagnosis: IOL Secondary Diagnosis:IUGR, A1GDM, CHTN Patient Active Problem List   Diagnosis Date Noted   Encounter for induction of labor 04/14/2021   Indication for care in labor and delivery, antepartum 03/22/2021   High-risk pregnancy 10/05/2020   Labor and delivery, indication for care 05/18/2019   Gestational diabetes 03/16/2019   Anemia in pregnancy 03/11/2019   Elevated glucose level 03/11/2019    Augmentation: AROM and Pitocin Complications: None Intrapartum complications/course: GBS Pos Delivery Type: spontaneous vaginal delivery Anesthesia: None Placenta: spontaneous Laceration: N/A Episiotomy: none Newborn Data: Live born female  Birth Weight:  6#5 APGAR: 8, 8  Newborn Delivery   Birth date/time: 04/14/2021 17:51:00 Delivery type: Vaginal, Spontaneous     Postpartum Procedures: none  Edinburgh:  Edinburgh Postnatal Depression Scale Screening Tool 04/15/2021 04/14/2021 05/19/2019  I have been able to laugh and see the funny side of things. 0 (No Data) 0  I have looked forward with enjoyment to things. 0 - 0  I have blamed myself unnecessarily when things went wrong. 1 - 1  I have been anxious or worried for no good reason. 2 - 1  I have felt scared or panicky for no good reason. 1 - 1  Things have been getting on top of me. 1 - 2  I have been so unhappy that I have had difficulty sleeping. 0 - 0  I have felt sad or miserable. 0 - 1  I have been so unhappy that I have  been crying. 0 - 1  The thought of harming myself has occurred to me. 0 - 0  Edinburgh Postnatal Depression Scale Total 5 - 7     Post partum course:  Patient had an uncomplicated postpartum course.  By time of discharge on PPD#1, her pain was controlled on oral pain medications; she had appropriate lochia and was ambulating, voiding without difficulty and tolerating regular diet.  She was deemed stable for discharge to home.    Discharge Physical Exam:  BP 117/81 (BP Location: Left Arm)   Pulse 75   Temp 99.1 F (37.3 C) (Oral)   Resp 20   Ht 4\' 10"  (1.473 m)   Wt 62.1 kg   LMP  (LMP Unknown)   SpO2 97%   Breastfeeding Unknown   BMI 28.61 kg/m   General: NAD CV: RRR Pulm: CTABL, nl effort ABD: s/nd/nt, fundus firm and below the umbilicus Lochia: moderate Perineum:minimal edema/intact DVT Evaluation: LE non-ttp, no evidence of DVT on exam.  Hemoglobin  Date Value Ref Range Status  04/15/2021 12.1 12.0 - 15.0 g/dL Final  05/07/2019 12.9 11.1 - 15.9 g/dL Final   HCT  Date Value Ref Range Status  04/15/2021 35.9 (L) 36.0 - 46.0 % Final   Hematocrit  Date Value Ref Range Status  05/07/2019 39.0 34.0 - 46.6 % Final     Disposition: stable, discharge to home. Baby Feeding: breastmilk and formula Baby Disposition: home with mom  Rh Immune globulin given: N/A Rubella vaccine given: Immune Varivax vaccine given: Immune Flu vaccine  given in AP or PP setting: declined Tdap vaccine given in AP or PP setting: declined  Contraception: Husband to get Vasectomy  Prenatal Labs:  Blood type/Rh B Pos  Antibody screen neg  Rubella Immune  Varicella Immune  RPR NR  HBsAg Neg  HIV NR  GC neg  Chlamydia neg  Genetic screening negative  1 hour GTT 152  3 hour GTT 89, 162,135,130  GBS Positive    Plan:  Brenda Rogers was discharged to home in good condition. Follow-up appointment with delivering provider in 1 weeks.  Discharge Medications: Allergies as of  04/15/2021   No Known Allergies      Medication List     TAKE these medications    acetaminophen 325 MG tablet Commonly known as: Tylenol Take 2 tablets (650 mg total) by mouth every 4 (four) hours as needed (for pain scale < 4).   aspirin 81 MG chewable tablet Chew 81 mg by mouth daily.   benzocaine-Menthol 20-0.5 % Aero Commonly known as: DERMOPLAST Apply 1 application topically as needed for irritation (perineal discomfort).   coconut oil Oil Apply 1 application topically as needed.   ferrous sulfate 325 (65 FE) MG tablet Take 325 mg by mouth daily with breakfast.   ibuprofen 600 MG tablet Commonly known as: ADVIL Take 1 tablet (600 mg total) by mouth every 6 (six) hours. Start taking on: April 16, 2021   multivitamin-prenatal 27-0.8 MG Tabs tablet Take 1 tablet by mouth daily at 12 noon.   senna-docusate 8.6-50 MG tablet Commonly known as: Senokot-S Take 2 tablets by mouth daily.   simethicone 80 MG chewable tablet Commonly known as: MYLICON Chew 1 tablet (80 mg total) by mouth as needed for flatulence.   witch hazel-glycerin pad Commonly known as: TUCKS Apply 1 application topically as needed for hemorrhoids (for pain).         Follow-up Information     Chari Manning Rolla Plate, CNM Follow up in 1 week(s).   Specialty: Obstetrics Why: BP check follow-up Contact information: [address] [place] [address] [phone]                 Signed: 04/15/2021 6:47 PM Randa Ngo, CNM

## 2021-04-14 NOTE — Progress Notes (Signed)
Labor Progress Note  Brenda Rogers is a 35 y.o. E3P2951 at 107w6d by LMP admitted for induction of labor due to Gestational diabetes, Hypertension, and Poor fetal growth.  Subjective: resting in bed breathing and talking through contractions  Objective: BP 133/78   Pulse 83   Temp 98.9 F (37.2 C) (Oral)   Resp 20   Ht 4\' 10"  (1.473 m)   Wt 62.1 kg   LMP  (LMP Unknown)   SpO2 100%   BMI 28.61 kg/m  Notable VS details: CBG WNL  Fetal Assessment: FHT:  FHR: 145 bpm, variability: moderate,  accelerations:  Present,  decelerations:  Present early, variable decelerations Category/reactivity:  Category II UC:   regular, every 2-3 minutes SVE:   5/60-2 Membrane status: AROM Amniotic color: Clear, moderate amount  Labs: Lab Results  Component Value Date   WBC 6.4 04/14/2021   HGB 12.6 04/14/2021   HCT 38.0 04/14/2021   MCV 90.3 04/14/2021   PLT 148 (L) 04/14/2021    Assessment / Plan: Augmentation of labor, progressing well  Labor: progressing well on Pitocin, currently on 66mill/units, AROM explained and performed with patient/spouse consent. Fetal Wellbeing:  Category II Pain Control:  Labor support without medications I/D:  GBS pos, PCN dose x 2 Anticipated MOD:  NSVD  30m CNM

## 2021-04-15 LAB — CBC
HCT: 35.9 % — ABNORMAL LOW (ref 36.0–46.0)
Hemoglobin: 12.1 g/dL (ref 12.0–15.0)
MCH: 30.4 pg (ref 26.0–34.0)
MCHC: 33.7 g/dL (ref 30.0–36.0)
MCV: 90.2 fL (ref 80.0–100.0)
Platelets: 143 10*3/uL — ABNORMAL LOW (ref 150–400)
RBC: 3.98 MIL/uL (ref 3.87–5.11)
RDW: 16 % — ABNORMAL HIGH (ref 11.5–15.5)
WBC: 9.3 10*3/uL (ref 4.0–10.5)
nRBC: 0 % (ref 0.0–0.2)

## 2021-04-15 LAB — RPR: RPR Ser Ql: NONREACTIVE

## 2021-04-15 MED ORDER — ACETAMINOPHEN 325 MG PO TABS: 650.0000 mg | ORAL_TABLET | ORAL | Status: AC | PRN

## 2021-04-15 MED ORDER — SIMETHICONE 80 MG PO CHEW: 80.0000 mg | CHEWABLE_TABLET | ORAL | 0 refills | Status: AC | PRN

## 2021-04-15 MED ORDER — SENNOSIDES-DOCUSATE SODIUM 8.6-50 MG PO TABS: 2.0000 | ORAL_TABLET | ORAL | 0 refills | Status: AC

## 2021-04-15 MED ORDER — WITCH HAZEL-GLYCERIN EX PADS: 1.0000 "application " | MEDICATED_PAD | CUTANEOUS | 12 refills | Status: AC | PRN

## 2021-04-15 MED ORDER — COCONUT OIL OIL: 1.0000 "application " | TOPICAL_OIL | 0 refills | Status: AC | PRN

## 2021-04-15 MED ORDER — BENZOCAINE-MENTHOL 20-0.5 % EX AERO: 1.0000 "application " | INHALATION_SPRAY | CUTANEOUS | Status: AC | PRN

## 2021-04-15 MED ORDER — IBUPROFEN 600 MG PO TABS: 600.0000 mg | ORAL_TABLET | Freq: Four times a day (QID) | ORAL | 0 refills | Status: AC

## 2021-04-15 NOTE — Progress Notes (Signed)
Reviewed D/C instructions with pt and family. Pt verbalized understanding of teaching. Discharged to home via W/C. Pt to schedule f/u appt.  

## 2021-04-15 NOTE — Progress Notes (Signed)
   04/15/21 0900  Clinical Encounter Type  Visited With Patient and family together  Visit Type Initial  Referral From Chaplain  Consult/Referral To Chaplain  Spiritual Encounters  Spiritual Needs Emotional;Prayer  Chaplain Eulene Pekar visited Pt in room 345A, Brenda Rogers. The father was holding their newborn and sitting by the bedside. Pt and family were in good spirits. I provided spiritual and emotional support and I congratulated them on their newborn baby girl.

## 2021-04-15 NOTE — Discharge Instructions (Signed)

## 2021-06-07 ENCOUNTER — Other Ambulatory Visit: Payer: Self-pay

## 2021-06-07 ENCOUNTER — Emergency Department
Admission: EM | Admit: 2021-06-07 | Discharge: 2021-06-07 | Disposition: A | Discharge status: Home / Self Care | Payer: BC Managed Care – PPO | Attending: Emergency Medicine | Admitting: Emergency Medicine

## 2021-06-07 DIAGNOSIS — I10 Essential (primary) hypertension: Secondary | ICD-10-CM | POA: Diagnosis present

## 2021-06-07 DIAGNOSIS — F419 Anxiety disorder, unspecified: Secondary | ICD-10-CM | POA: Insufficient documentation

## 2021-06-07 DIAGNOSIS — R030 Elevated blood-pressure reading, without diagnosis of hypertension: Secondary | ICD-10-CM

## 2021-06-07 NOTE — ED Triage Notes (Signed)
Pt to ED for HTN yesterday.  Stats hx of same, used to take meds, stopped meds after pregnancy.  Not currently pregnant. Gave birth November 9

## 2021-06-07 NOTE — Discharge Instructions (Signed)
Follow-up with Dr. Leafy Ro.  Please call for an appointment.  I will send her secure message notifying her that you were seen in the emergency department and instructed to start your nifedipine.  Return if worsening

## 2021-06-07 NOTE — ED Provider Notes (Signed)
[facility] Provider Note    Event Date/Time   First MD Initiated Contact with Patient 06/07/21 [phone]     (approximate)   History   Hypertension   HPI  Brenda Rogers is a 36 y.o. female presents emergency department with elevated blood pressure.  Patient is 2 months postpartum.  States her anxiety gets bad and blood pressures been elevated.  Had elevated blood pressure after her first pregnancy.  She is to take nifedipine.  Took her blood pressure at home today and it was very elevated.  Became concerned that she has had a headache.  No chest pain or shortness of breath.  No nausea or vomiting.  No change in vision.      Physical Exam   Triage Vital Signs: ED Triage Vitals  Enc Vitals Group     BP 06/07/21 0737 (!) 173/107     Pulse Rate 06/07/21 0737 (!) 120     Resp 06/07/21 0737 20     Temp 06/07/21 0737 98.1 F (36.7 C)     Temp src --      SpO2 06/07/21 0737 96 %     Weight 06/07/21 0738 145 lb (65.8 kg)     Height 06/07/21 0738 4\' 10"  (1.473 m)     Head Circumference --      Peak Flow --      Pain Score 06/07/21 0738 0     Pain Loc --      Pain Edu? --      Excl. in GC? --     Most recent vital signs: Vitals:   06/07/21 0737 06/07/21 0740  BP: (!) 173/107   Pulse: (!) 120 (!) 114  Resp: 20   Temp: 98.1 F (36.7 C)   SpO2: 96%      General: Awake, no distress.   CV:  Good peripheral perfusion.  Heart sounds are normal, regular rate and rhythm, no murmur rub or gallop, no bruits noted Resp:  Normal effort.  Lungs CTA Abd:  No distention.   Other:      ED Results / Procedures / Treatments   Labs (all labs ordered are listed, but only abnormal results are displayed) Labs Reviewed - No data to display   EKG  None   RADIOLOGY  None   PROCEDURES:    Procedures   MEDICATIONS ORDERED IN ED: Medications - No data to display   IMPRESSION / MDM / ASSESSMENT AND PLAN / ED COURSE  I reviewed the triage vital  signs and the nursing notes.                              Differential diagnosis includes, but is not limited to,   Preeclampsia, hypertension, anxiety  I did explain the findings to the patient.  Her blood pressure is elevated here in the ED.  Heart rate was elevated also due to her anxiety.  Physical exam does not show any acute abnormalities other than blood pressure being elevated.  Patient has her nifedipine with her.  Explained her she should take her blood pressure medication and follow-up with her regular doctor.  I did offer labs.  Due to the extensive wait they opted to follow-up with her regular doctor.  I feel that this is appropriate.  Feel the hypertension is due to her not taking her blood pressure medication.  She is to follow-up with Dr. 08/05/21.  She is  discharged stable condition.      FINAL CLINICAL IMPRESSION(S) / ED DIAGNOSES   Final diagnoses:  Elevated blood pressure reading     Rx / DC Orders   ED Discharge Orders     None        Note:  This document was prepared using Dragon voice recognition software and may include unintentional dictation errors.    Faythe Ghee, PA-C 06/07/21 [id number]    Minna Antis, MD 06/07/21 [phone]

## 2022-03-21 ENCOUNTER — Encounter: Payer: Self-pay | Admitting: Certified Nurse Midwife

## 2022-03-23 ENCOUNTER — Encounter: Payer: Self-pay | Admitting: Certified Nurse Midwife

## 2023-05-12 IMAGING — US US MFM OB DETAIL+14 WK
1 series · 13 of 28 positions shown · non-contrast
Comparison: none

[Series 1: us mfm ob detail+14 wk · 13 of 74 slices shown]
[im 3/74]
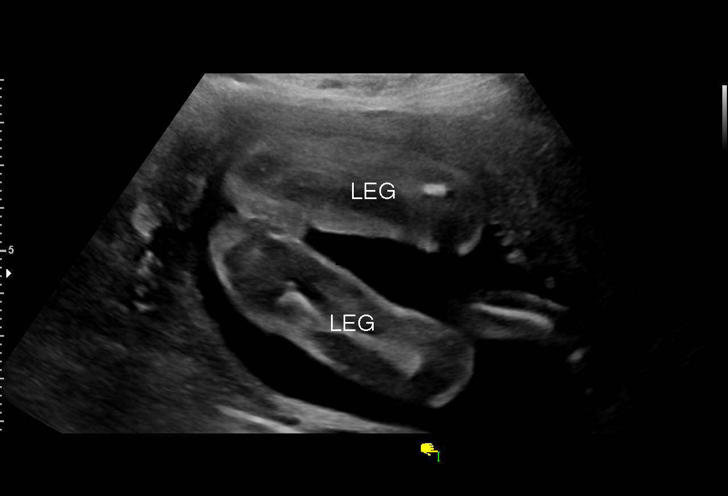
[im 9/74]
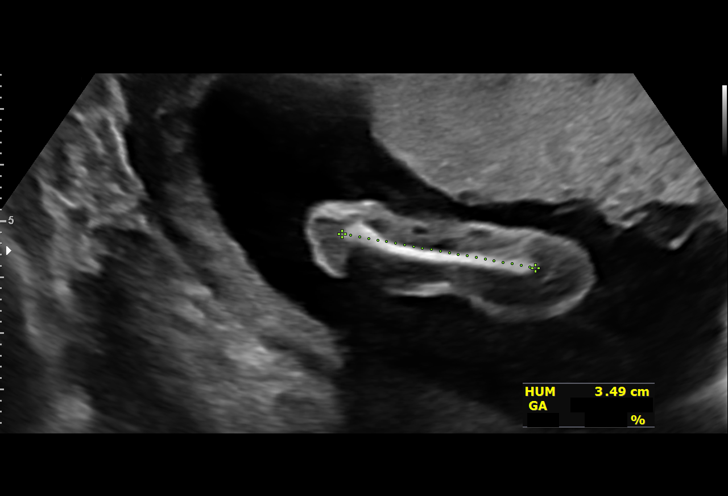
[im 14/74]
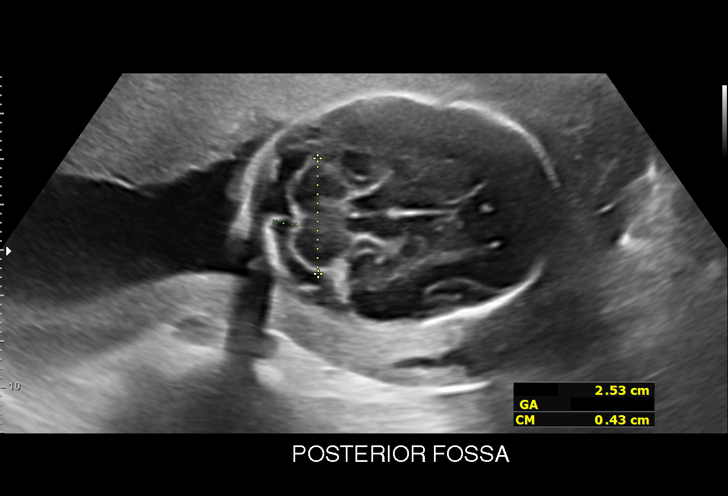
[im 19/74]
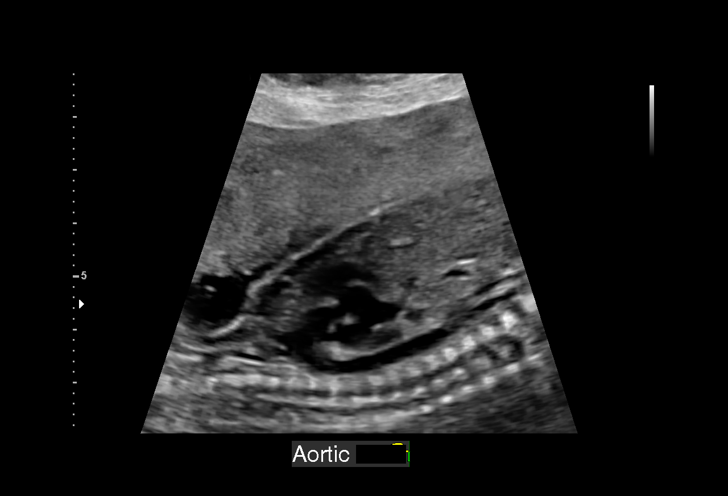
[im 25/74]
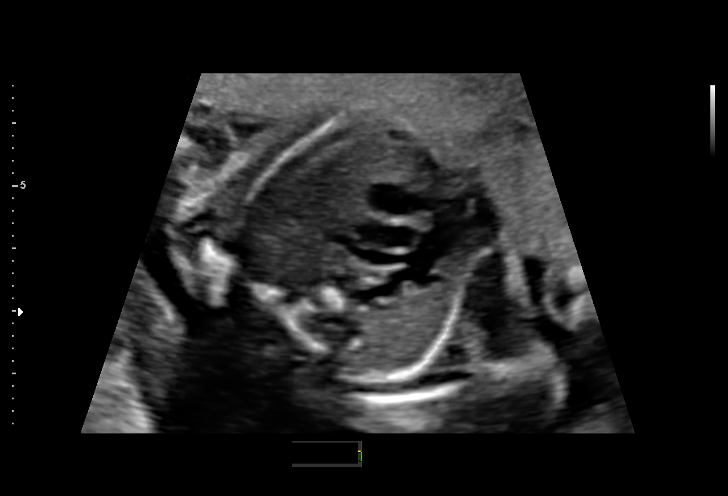
[im 30/74]
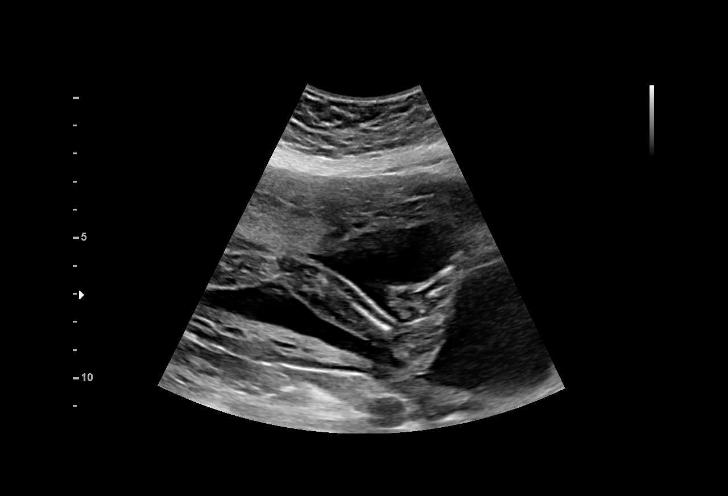
[im 38/74]
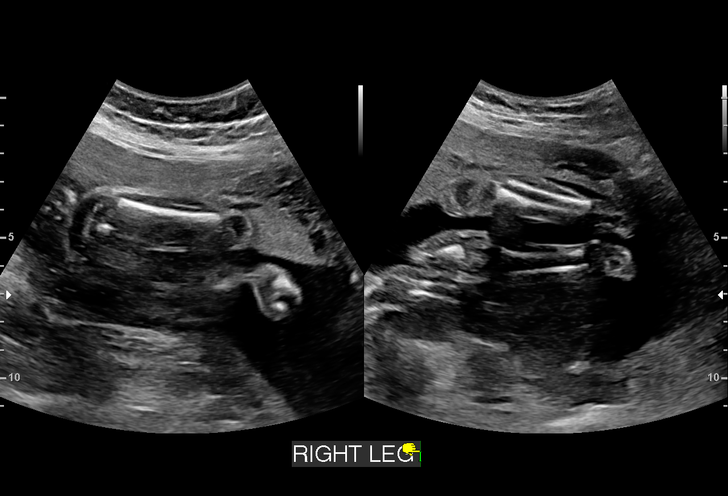
[im 44/74]
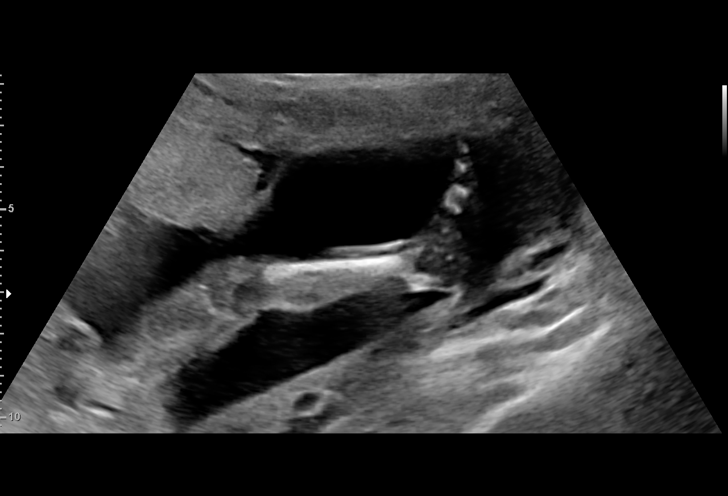
[im 49/74]
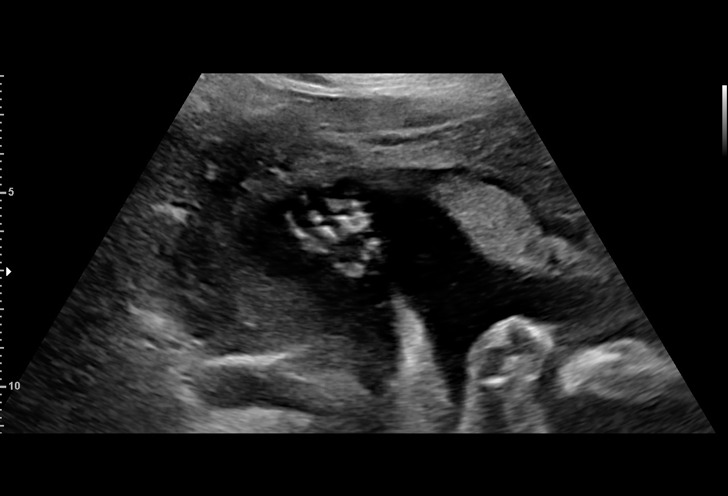
[im 55/74]
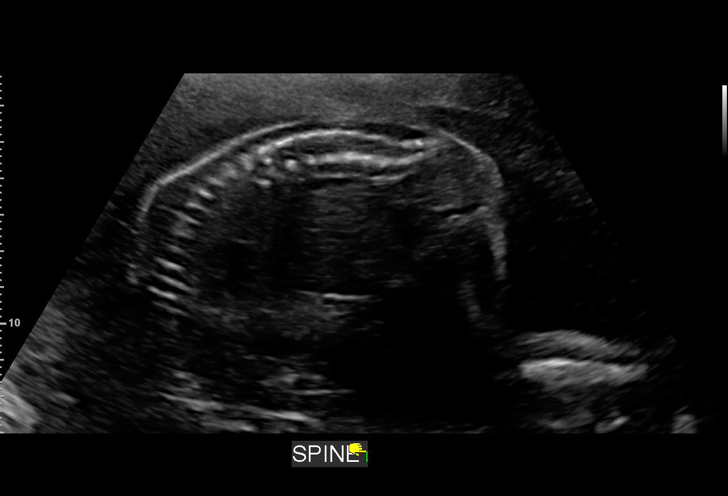
[im 60/74]
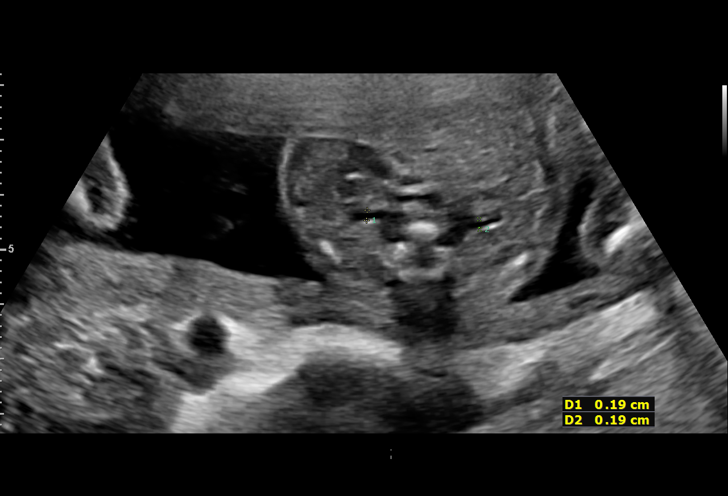
[im 65/74]
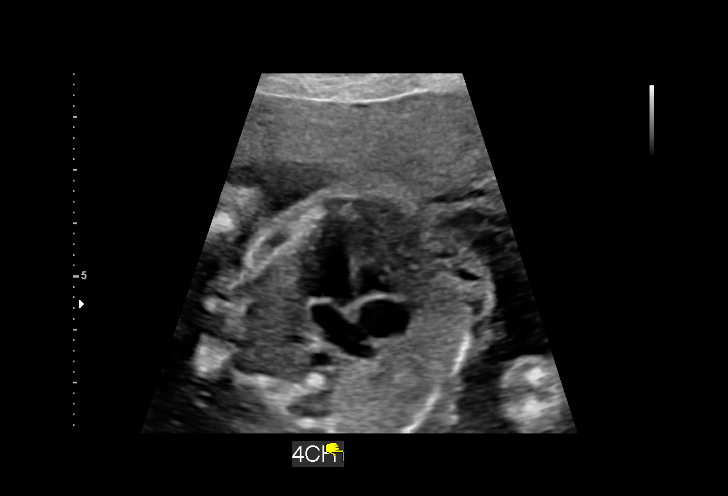
[im 71/74]
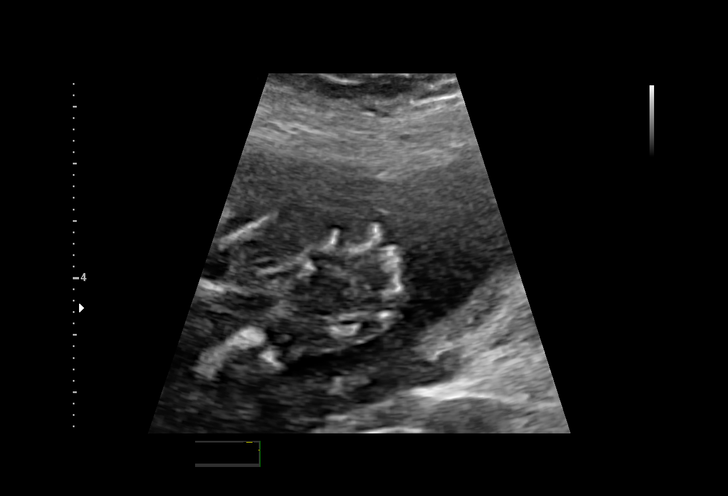

[13 of 28 positions shown; findings below may reference images not displayed]

Women[facility]
                   Sonographer                              [HOSPITAL] at
                   [facility]

                                                      FERNANDO RIBEIRO

Indications

 23 weeks gestation of pregnancy
 Hypertension - Chronic/Pre-existing (on
 procardia)
 Poor obstetric history: Previous gestational
 diabetes
Fetal Evaluation

 Num Of Fetuses:         1
 Fetal Heart Rate(bpm):  155
 Cardiac Activity:       Observed
 Presentation:           Transverse
 Placenta:               Anterior
 P. Cord Insertion:      Visualized, central

 Amniotic Fluid
 AFI FV:      Within normal limits
Biometry

 BPD:      53.9  mm     G. Age:  22w 3d         23  %    CI:        71.43   %    70 - 86
                                                         FL/HC:      18.4   %    19.2 -
 HC:      203.1  mm     G. Age:  22w 3d         16  %    HC/AC:      1.17        1.05 -
 AC:       173   mm     G. Age:  22w 2d         20  %    FL/BPD:     69.4   %    71 - 87
 FL:       37.4  mm     G. Age:  21w 6d         11  %    FL/AC:      21.6   %    20 - 24
 HUM:      35.2  mm     G. Age:  22w 1d         25  %
 CER:      25.3  mm     G. Age:  23w 0d         80  %

 LV:        5.5  mm
 CM:        4.3  mm
 Est. FW:     480  gm      1 lb 1 oz     11  %
Gestational Age

 LMP:           23w 0d        Date:  07/16/20                 EDD:   04/22/21
 U/S Today:     22w 2d                                        EDD:   04/27/21
 Best:          23w 0d     Det. By:  LMP  (07/16/20)          EDD:   04/22/21
Anatomy

 Cranium:               Appears normal         Aortic Arch:            Appears normal
 Cavum:                 Appears normal         Ductal Arch:            Appears normal
 Ventricles:            Appears normal         Diaphragm:              Appears normal
 Choroid Plexus:        Appears normal         Stomach:                Appears normal, left
                                                                       sided
 Cerebellum:            Appears normal         Abdomen:                Appears normal
 Posterior Fossa:       Appears normal         Abdominal Wall:         Appears nml (cord
                                                                       insert, abd wall)
 Nuchal Fold:           Appears normal         Cord Vessels:           Appears normal (3
                                                                       vessel cord)
 Face:                  Appears normal         Kidneys:                Appear normal
                        (orbits and profile)
 Lips:                  Appears normal         Bladder:                Appears normal
 Heart:                 Appears normal         Spine:                  Not well visualized
                        (4CH, axis, and
                        situs)
 RVOT:                  Appears normal         Upper Extremities:      Appears normal
 LVOT:                  Appears normal         Lower Extremities:      Appears normal
Cervix Uterus Adnexa

 Cervix
 Length:           4.65  cm.
 Normal appearance by transabdominal scan.

 Right Ovary
 Within normal limits.

 Left Ovary
 Within normal limits.
Impression

 Single intrauterine pregnancy here for a detailed anatomy
 due to chronic hypertension
 Normal anatomy with measurements consistent with dates
 There is good fetal movement and amniotic fluid volume
 Suboptimal views of the fetal anatomy (spine) were obtained
 secondary to fetal position.

 In addition we discussed the use of daily low dose ASA for
 the prevention of preeclampsia.

 A consult for chronic hypertension in pregnancy was
 performed please see [REDACTED] for details.
Recommendations

 Follow up growth in 4 weeks to complete the fetal anatomy.

## 2023-08-18 IMAGING — US US MFM FETAL BPP W/O NON-STRESS
1 series · 15 of 25 positions shown · non-contrast
Comparison: none

[Series 1: us mfm fetal bpp w/o non-stress · 25 acquisitions, 15 frames shown]
[im 1/25]
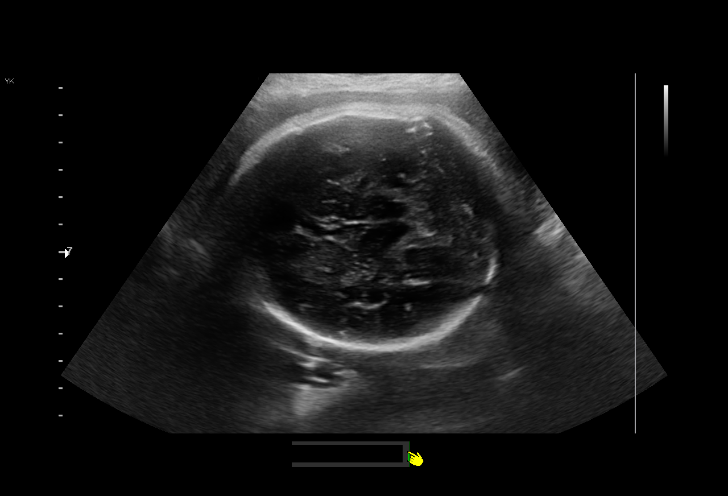
[im 3/25]
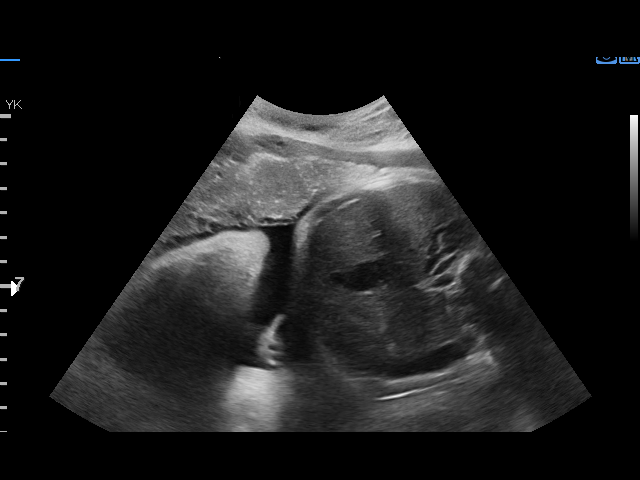
[im 5/25]
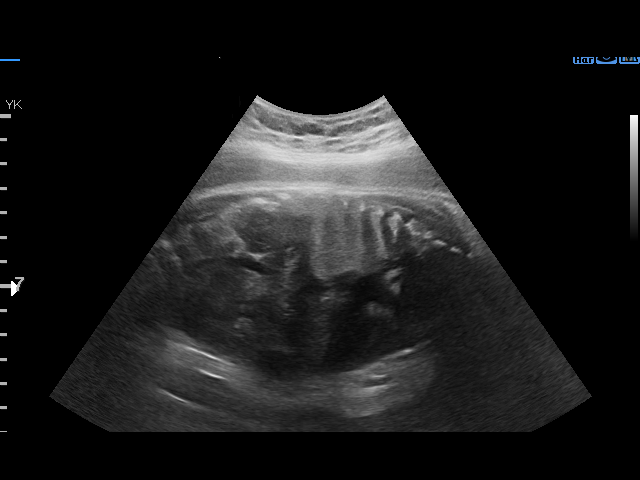
[im 6/25]
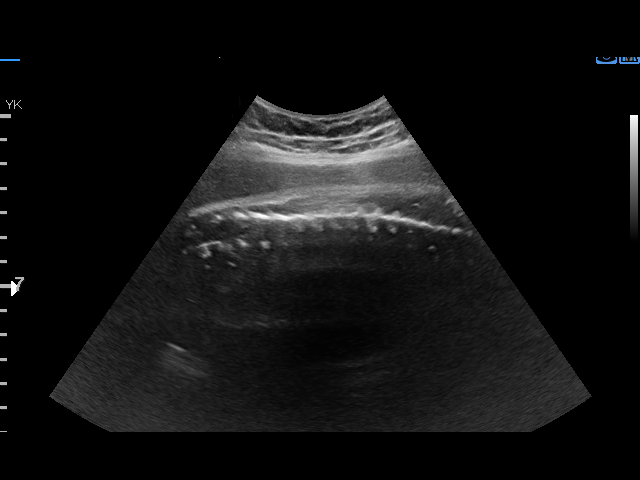
[im 8/25]
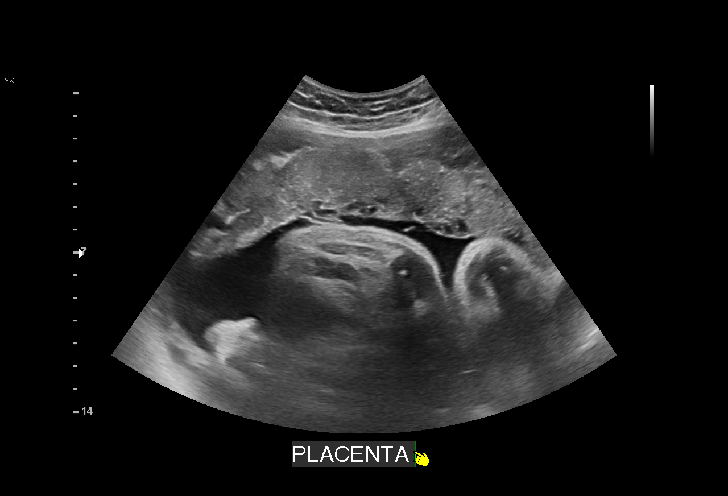
[im 10/25]
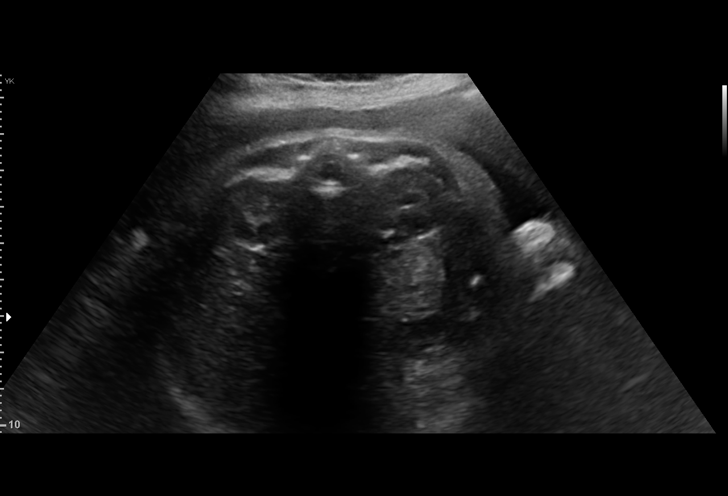
[im 11/25]
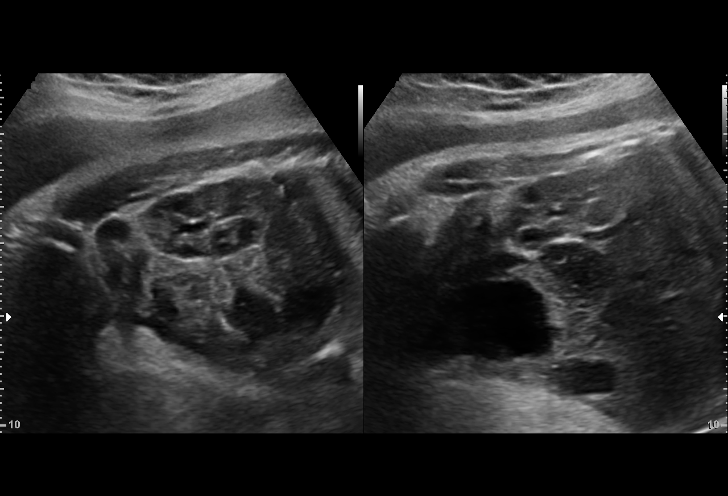
[im 13/25]
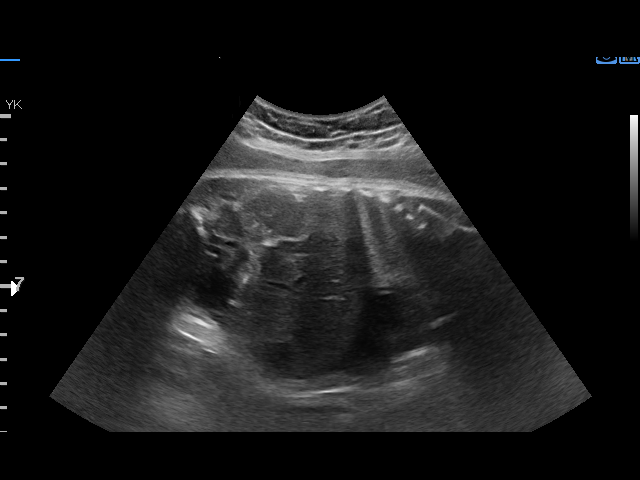
[im 15/25]
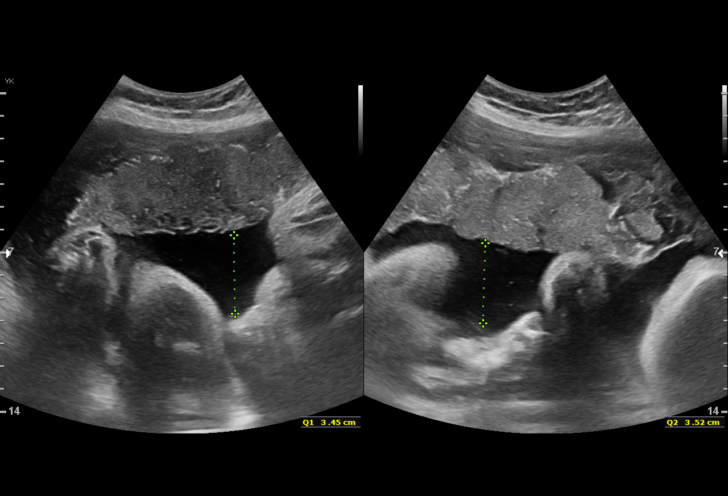
[im 16/25]
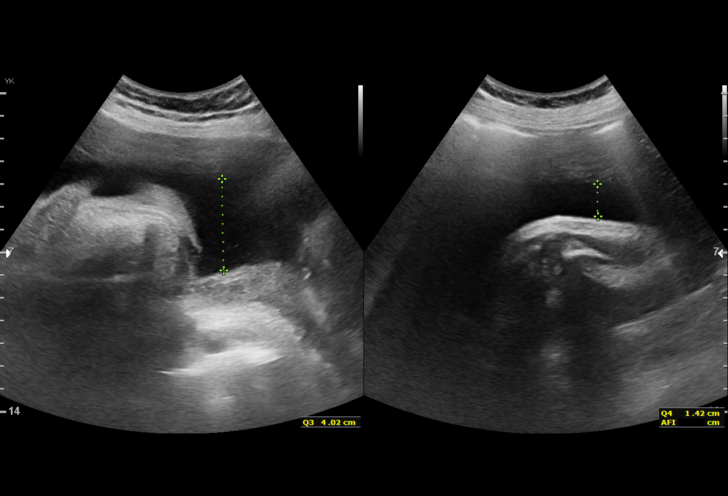
[im 18/25]
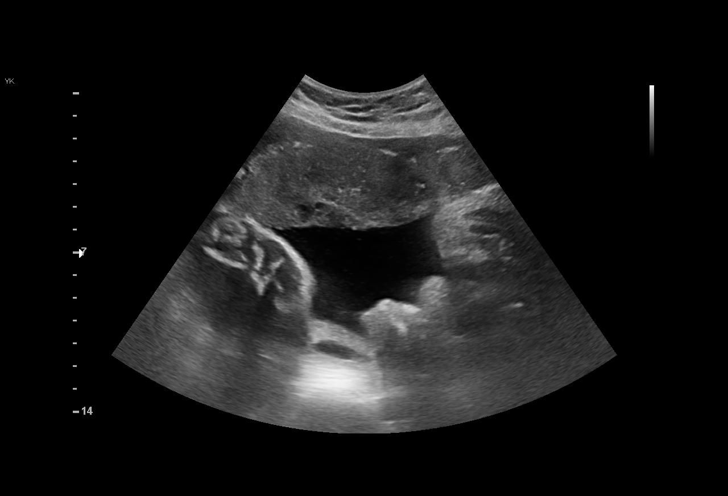
[im 20/25]
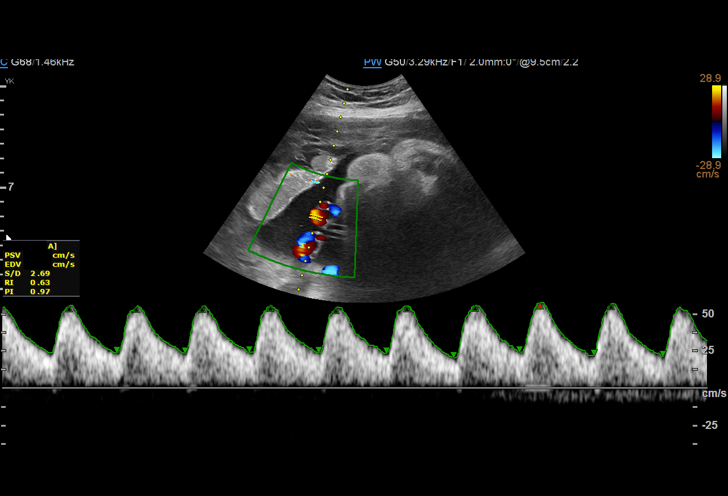
[im 21/25]
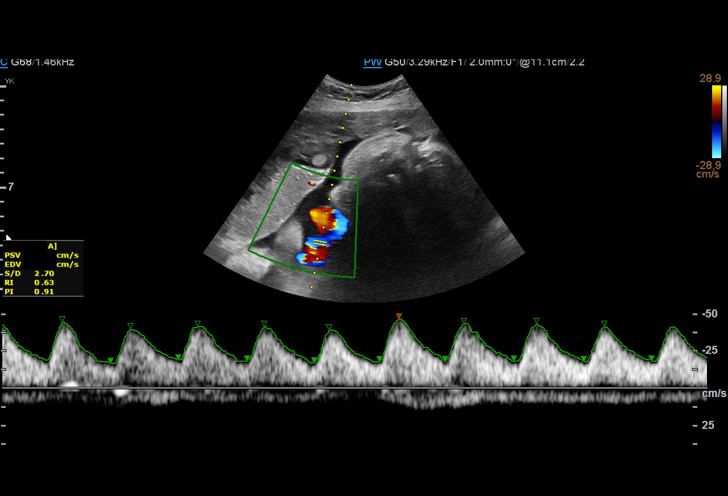
[im 23/25]
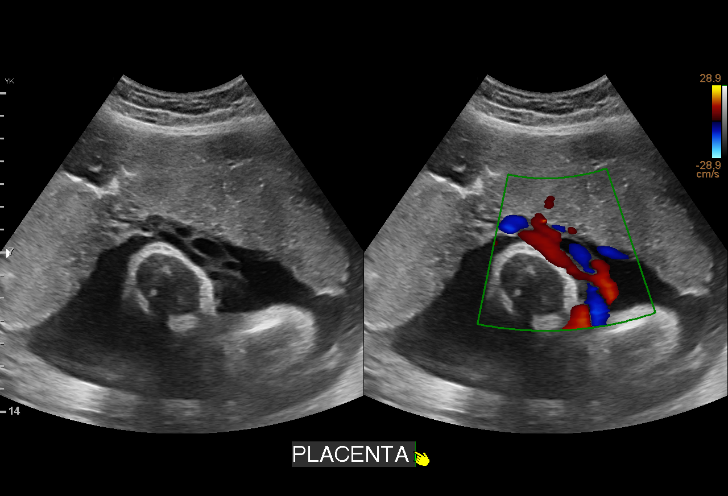
[im 25/25]
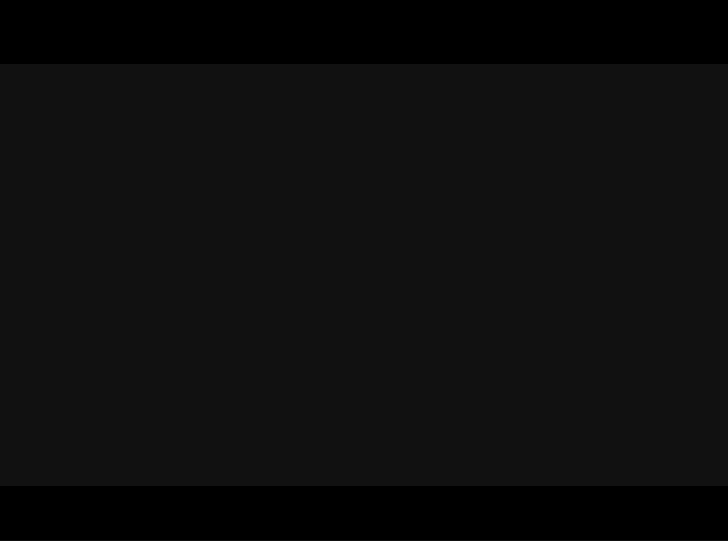

[15 of 25 positions shown; findings below may reference images not displayed]

2  US MFM UA CORD DOPPLER                76820.02    ABNEY
                                                      KGOTHATSO

Indications

 Diabetes - Gestational
 Hypertension - Chronic/Pre-existing (ASA)
 Maternal care for known or suspected poor
 fetal growth, third trimester, fetus 1 IUGR
 Poor obstetric history (prior pre-term labor)
 Poor obstetric history: Previous gestational
 diabetes
 Poor obstetric history: Previous fetal growth
 restriction (FGR)
 Low risk NIPS
 37 weeks gestation of pregnancy
Fetal Evaluation

 Num Of Fetuses:         1
 Fetal Heart Rate(bpm):  155
 Cardiac Activity:       Observed
 Presentation:           Cephalic
 Placenta:               Anterior
 P. Cord Insertion:      Visualized, central

 Amniotic Fluid
 AFI FV:      Within normal limits

 AFI Sum(cm)     %Tile       Largest Pocket(cm)
 12.4            42          4
 RUQ(cm)       RLQ(cm)       LUQ(cm)        LLQ(cm)
 3.5           1.4           3.5            4
Biophysical Evaluation

 Amniotic F.V:   Within normal limits       F. Tone:        Observed
 F. Movement:    Observed                   Score:          [DATE]
 F. Breathing:   Observed
Gestational Age

 LMP:           37w 0d        Date:  07/16/20                 EDD:   04/22/21
 Best:          37w 0d     Det. By:  LMP  (07/16/20)          EDD:   04/22/21
Anatomy

 Ventricles:            Appears normal         Kidneys:                Appear normal
 Stomach:               Appears normal, left   Bladder:                Appears normal
                        sided
Doppler - Fetal Vessels

 Umbilical Artery
  S/D     %tile      RI    %tile      PI    %tile            ADFV    RDFV
  2.63       68    0.62       75    0.92       73               No      No

Impression

 Antenatal testing due to IUGR with an EFW 11th% with an
 AC < 4th% on a prior exam
 Biophysical profile [DATE] with good fetal movement and
 amniotic fluid volume
 UA Dopplers are normal with no evidence of AEDF or REDF

 She reports good fetal movement and amniotic fluid volume.

 PYRLD- Ms. Ferienhaus reports good blood sugars with one
 abnormal fasting.
Recommendations

 Continue weekly testing with UA Dopplers
 She is scheduled for IOL on [DATE]
